# Patient Record
Sex: Female | Born: 1980 | Race: White | Hispanic: No | Marital: Married | Smoking: Never smoker
Health system: Southern US, Community
[De-identification: ages and names within clinical notes are randomized; demographics above are authoritative.]

## PROBLEM LIST (undated history)

## (undated) DIAGNOSIS — D649 Anemia, unspecified: Secondary | ICD-10-CM

## (undated) DIAGNOSIS — D333 Benign neoplasm of cranial nerves: Secondary | ICD-10-CM

## (undated) DIAGNOSIS — N200 Calculus of kidney: Secondary | ICD-10-CM

## (undated) DIAGNOSIS — R87820 Cervical low risk human papillomavirus (HPV) DNA test positive: Secondary | ICD-10-CM

## (undated) DIAGNOSIS — T783XXA Angioneurotic edema, initial encounter: Secondary | ICD-10-CM

## (undated) DIAGNOSIS — L509 Urticaria, unspecified: Secondary | ICD-10-CM

## (undated) HISTORY — PX: ESSURE TUBAL LIGATION: SUR464

## (undated) HISTORY — DX: Benign neoplasm of cranial nerves: D33.3

## (undated) HISTORY — PX: TUBAL LIGATION: SHX77

## (undated) HISTORY — PX: EYE SURGERY: SHX253

## (undated) HISTORY — DX: Anemia, unspecified: D64.9

## (undated) HISTORY — DX: Calculus of kidney: N20.0

## (undated) HISTORY — DX: Cervical low risk human papillomavirus (HPV) DNA test positive: R87.820

## (undated) HISTORY — DX: Urticaria, unspecified: L50.9

## (undated) HISTORY — DX: Angioneurotic edema, initial encounter: T78.3XXA

---

## 2008-06-08 HISTORY — PX: CHOLECYSTECTOMY: SHX55

## 2012-12-14 HISTORY — PX: ACOUSTIC NEUROMA RESECTION: SHX5713

## 2013-07-19 LAB — BASIC METABOLIC PANEL
Creatinine: 0.8 mg/dL (ref 0.5–1.1)
GLUCOSE: 81 mg/dL
Potassium: 4.3 mmol/L (ref 3.4–5.3)

## 2013-07-19 LAB — LIPID PANEL
CHOLESTEROL: 139 mg/dL (ref 0–200)
HDL: 50 mg/dL (ref 35–70)
LDL CALC: 66 mg/dL
Triglycerides: 115 mg/dL (ref 40–160)

## 2013-07-19 LAB — TSH: TSH: 5.1 u[IU]/mL (ref 0.41–5.90)

## 2013-07-19 LAB — HEPATIC FUNCTION PANEL
ALT: 33 U/L (ref 7–35)
AST: 26 U/L (ref 13–35)

## 2013-07-19 LAB — CALCIUM: Calcium: 9 mg/dL

## 2014-03-13 ENCOUNTER — Ambulatory Visit: Payer: Self-pay | Admitting: Family Medicine

## 2014-03-20 ENCOUNTER — Ambulatory Visit (INDEPENDENT_AMBULATORY_CARE_PROVIDER_SITE_OTHER): Payer: 59 | Admitting: Family Medicine

## 2014-03-20 ENCOUNTER — Encounter (INDEPENDENT_AMBULATORY_CARE_PROVIDER_SITE_OTHER): Payer: Self-pay

## 2014-03-20 ENCOUNTER — Encounter: Payer: Self-pay | Admitting: Family Medicine

## 2014-03-20 VITALS — BP 112/75 | HR 87 | Ht 69.0 in | Wt 214.0 lb

## 2014-03-20 DIAGNOSIS — H905 Unspecified sensorineural hearing loss: Secondary | ICD-10-CM

## 2014-03-20 DIAGNOSIS — R208 Other disturbances of skin sensation: Secondary | ICD-10-CM

## 2014-03-20 DIAGNOSIS — R200 Anesthesia of skin: Secondary | ICD-10-CM | POA: Insufficient documentation

## 2014-03-20 DIAGNOSIS — L659 Nonscarring hair loss, unspecified: Secondary | ICD-10-CM

## 2014-03-20 NOTE — Progress Notes (Signed)
CC: Sarah Simmons is a 33 y.o. female is here for Establish Care and losing hair   Subjective: HPI:  Pleasant 33 year old here to establish care nurse with Sarah Simmons cone  Patient complains of right facial numbness has been present for about 2 years now on almost daily basis. 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 Symptoms were present prior to the resection following resection. Symptoms have been more noticeable bothersome over the past one to 2 months. Currently localized to the cheek extending down to the corner of the lip worse after periods of fatigue nothing else seems to make better or worse. She denies any other new motor or sensory disturbances but has been left with 100% hearing loss in the right ear.  She wants to know whether be wise to get a new opinion from a local neurologist.  Complains of hair loss localized to the scalp that has been present for the past 2-4 weeks on a daily basis. It is described as homogenous and occurring nowhere else on the body. Nothing seems to make symptoms better or worse interventions have included avoiding brushing were not necessary. She has a family history of hypothyroidism in her grandmother and she has been told that she had borderline "thyroid problems"in the past. She's also had unintentional 16, weight loss over the summer without any change in diet or physical activity. She denies any psychological or physical trauma/stress over the past 6 months   Review of Systems - General ROS: negative for - chills, fever, night sweats,  weight loss Ophthalmic ROS: negative for - decreased vision Psychological ROS: negative for - anxiety or depression ENT ROS: negative for - hearing change, nasal congestion, tinnitus or allergies Hematological and Lymphatic ROS: negative for - bleeding problems, bruising or swollen  lymph nodes Breast ROS: negative Respiratory ROS: no cough, shortness of breath, or wheezing Cardiovascular ROS: no chest pain or dyspnea on exertion Gastrointestinal ROS: no abdominal pain, change in bowel habits, or black or bloody stools Genito-Urinary ROS: negative for - genital discharge, genital ulcers, incontinence or abnormal bleeding from genitals Musculoskeletal ROS: negative for - joint pain or muscle pain Neurological ROS: negative for - headaches or memory loss Dermatological ROS: negative for lumps, mole changes, rash and skin lesion changes  History reviewed. No pertinent past medical history.  Past Surgical History  Procedure Laterality Date  . 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 Cholecystectomy  2010  . Eye surgery      x3   Family History  Problem Relation Age of Onset  . Uterine cancer Sister   . Liver cancer Father   . Transient ischemic attack      grandparents    History   Social History  . Marital Status: Married    Spouse Name: N/A    Number of Children: N/A  . Years of Education: N/A   Occupational History  . Not on file.   Social History Main Topics  . Smoking status: Never Smoker   . Smokeless tobacco: Not on file  . Alcohol Use: No  . Drug Use: No  . Sexual Activity: Yes    Partners: Male   Other Topics Concern  . Not on file   Social History Narrative  . No narrative on file     Objective: BP 112/75  Pulse 87  Ht 5\' 9"  (1.753 m)  Wt 214 lb (97.07 kg)  BMI 31.59 kg/m2  LMP 02/18/2014  General: Alert and Oriented, No Acute Distress HEENT: Pupils equal, round, reactive to light. Conjunctivae clear.  Moist mucous membranes pharynx unremarkable. Mild seborrheic dermatitis involving the entire scalp but mostly centrally, hair is thin in a homogenous pattern but negative pull test and no discrete areas of alopecia. Eyebrows and eyelashes unremarkable Lungs: Clear comfortable work of breathing Cardiac: Regular  rate and rhythm. Extremities: No peripheral edema.  Strong peripheral pulses.  Mental Status: No depression, anxiety, nor agitation. Skin: Warm and dry.  Assessment & Plan: Sarah Simmons was seen today for establish care and losing hair.  Diagnoses and associated orders for this visit:  Right facial numbness - Ambulatory referral to Neurology - BASIC METABOLIC PANEL WITH GFR  Hair loss - TSH - T4, free - T3, free - BASIC METABOLIC PANEL WITH GFR  Right-sided sensorineural hearing loss    Right facial numbness: Agree with referral to neurology pending metabolic panel above. I like her to have the opportunity for EMG if she is a candidate she does not believe she's never had this done.   Hair loss: Rule out thyroid abnormality and metabolic abnormality above the for further interventions.   Return if symptoms worsen or fail to improve.

## 2014-03-21 ENCOUNTER — Encounter: Payer: Self-pay | Admitting: *Deleted

## 2014-03-21 LAB — BASIC METABOLIC PANEL WITH GFR
BUN: 10 mg/dL (ref 6–23)
CO2: 24 meq/L (ref 19–32)
Calcium: 8.8 mg/dL (ref 8.4–10.5)
Chloride: 104 mEq/L (ref 96–112)
Creat: 0.84 mg/dL (ref 0.50–1.10)
GFR, Est Non African American: >89 mL/min
GLUCOSE: 86 mg/dL (ref 70–99)
POTASSIUM: 4.5 meq/L (ref 3.5–5.3)
Sodium: 138 mEq/L (ref 135–145)

## 2014-03-21 LAB — TSH: TSH: 2.305 u[IU]/mL (ref 0.350–4.500)

## 2014-03-21 LAB — T4, FREE: Free T4: 1.06 ng/dL (ref 0.80–1.80)

## 2014-03-21 LAB — T3, FREE: T3, Free: 3.3 pg/mL (ref 2.3–4.2)

## 2014-03-23 ENCOUNTER — Telehealth: Payer: Self-pay | Admitting: *Deleted

## 2014-03-23 NOTE — Telephone Encounter (Signed)
Pt called wanting to know lab results. Called # she left [phone], and [phone] ( I  couldn't tell what #) and both vm were female voices so I didn't leave a message

## 2014-04-11 ENCOUNTER — Ambulatory Visit (INDEPENDENT_AMBULATORY_CARE_PROVIDER_SITE_OTHER): Payer: 59 | Admitting: Diagnostic Neuroimaging

## 2014-04-11 ENCOUNTER — Encounter: Payer: Self-pay | Admitting: Diagnostic Neuroimaging

## 2014-04-11 ENCOUNTER — Telehealth: Payer: Self-pay | Admitting: *Deleted

## 2014-04-11 VITALS — BP 112/70 | HR 86 | Temp 97.9°F | Ht 69.0 in | Wt 218.4 lb

## 2014-04-11 DIAGNOSIS — H933X1 Disorders of right acoustic nerve: Secondary | ICD-10-CM

## 2014-04-11 DIAGNOSIS — R208 Other disturbances of skin sensation: Secondary | ICD-10-CM

## 2014-04-11 DIAGNOSIS — R200 Anesthesia of skin: Secondary | ICD-10-CM

## 2014-04-11 NOTE — Patient Instructions (Signed)
Monitor symptoms. I will review records and scans when available.

## 2014-04-11 NOTE — Progress Notes (Signed)
[facility]  PATIENT: Sarah Simmons DOB: 08-24-1980  REFERRING CLINICIAN: Hommel HISTORY FROM: patient  REASON FOR VISIT: new consult    HISTORICAL  CHIEF COMPLAINT:  Chief Complaint  Patient presents with  . Numbness    facial, R side    HISTORY OF PRESENT ILLNESS:   33 year old right-handed female here for evaluation of right facial numbness.  April 2014 patient was living in [place] and she noticed a small patch of numbness near her right cheekbone region. Symptoms were intermittent. She also had mild dizziness and balance problems. 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 Following surgery patient developed 100% right-sided hearing loss. She also had worsening balance problems. Right facial numbness resolved until October 2014 when it began to return intermittently.  Patient continues to have tingling, numbness, heaviness, pulling sensation in her right face maxillary region, once per week up to 3 times per day. Episodes can last 30 seconds up to 10 minutes at a time. No specific triggering factors. Patient has had total of 4 MRI scans of the brain and last scan was in February 2015, apparently unremarkable without tumor recurrence.  Patient has seen 2 neurosurgeons and a neurologist regarding ongoing intermittent facial numbness. Both neurosurgeons mention that they could not explain the facial numbness based on her acoustic neuroma or MRI scans. The neurologist stated that it was possible to have numbness explained by her condition.  Patient moved to [facility] in April 2015. Now presenting to establish with neurologist here.   REVIEW OF SYSTEMS: Full 14 system review of systems performed and notable only for skin rash numbness.  ALLERGIES: No Known Allergies  HOME MEDICATIONS: No outpatient prescriptions prior to visit.   No  facility-administered medications prior to visit.    PAST MEDICAL HISTORY: History reviewed. No pertinent past medical history.  PAST SURGICAL HISTORY: Past Surgical History  Procedure Laterality Date  . 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 Cholecystectomy  2010  . Eye surgery      x3    FAMILY HISTORY: Family History  Problem Relation Age of Onset  . Uterine cancer Sister   . Liver cancer Father   . Cancer Father   . Transient ischemic attack      grandparents  . Hypothyroidism Maternal Grandmother   . Transient ischemic attack Maternal Grandfather   . Transient ischemic attack Paternal Grandfather     SOCIAL HISTORY:  History   Social History  . Marital Status: Married    Spouse Name: [name]    Number of Children: 3  . Years of Education: Assoc   Occupational History  . RN [employer]   Social History Main Topics  . Smoking status: Never Smoker   . Smokeless tobacco: Never Used  . Alcohol Use: 0.0 oz/week    0 Not specified per week     Comment: rarely  . Drug Use: No  . Sexual Activity:    Partners: Male   Other Topics Concern  . Not on file   Social History Narrative   Patient lives at home with family.   Caffeine Use: occ.     PHYSICAL EXAM  Filed Vitals:   04/11/14 1117  BP: 112/70  Pulse: 86  Temp: 97.9 F (36.6 C)  TempSrc: Oral  Height: 5\' 9"  (1.753 m)  Weight: 218 lb 6.4 oz (99.066  kg)    Not recorded      Visual Acuity Screening   Right eye Left eye Both eyes  Without correction:     With correction: 20/50 20/50      Body mass index is 32.24 kg/(m^2).  GENERAL EXAM: Patient is in no distress; well developed, nourished and groomed; neck is supple  CARDIOVASCULAR: Regular rate and rhythm, no murmurs, no carotid bruits  NEUROLOGIC: MENTAL STATUS: awake, alert, oriented to person, place and time, recent and remote memory intact, normal attention and concentration, language fluent, comprehension  intact, naming intact, fund of knowledge appropriate CRANIAL NERVE: no papilledema on fundoscopic exam, pupils equal and reactive to light, visual fields full to confrontation, extraocular muscles intact, no nystagmus, SLIGHT DECR LT SENS IN RIGHT MAXILLARY / CHEEK BONE REGION (1 [place]), facial strength symmetric, hearing intact, palate elevates symmetrically, uvula midline, shoulder shrug symmetric, tongue midline. MOTOR: normal bulk and tone, full strength in the BUE, BLE SENSORY: normal and symmetric to light touch, pinprick, temperature, vibration COORDINATION: finger-nose-finger, fine finger movements normal REFLEXES: deep tendon reflexes present and symmetric GAIT/STATION: narrow based gait; able to walk on toes, heels and tandem, romberg is negative    DIAGNOSTIC DATA (LABS, IMAGING, TESTING) - I reviewed patient records, labs, notes, testing and imaging myself where available.  No results found for: WBC, HGB, HCT, MCV, PLT    Component Value Date/Time   NA 138 03/20/2014 1415   K 4.5 03/20/2014 1415   CL 104 03/20/2014 1415   CO2 24 03/20/2014 1415   GLUCOSE 86 03/20/2014 1415   BUN 10 03/20/2014 1415   CREATININE 0.84 03/20/2014 1415   CALCIUM 8.8 03/20/2014 1415   GFRNONAA >89 03/20/2014 1415   GFRAA >89 03/20/2014 1415   No results found for: CHOL, HDL, LDLCALC, LDLDIRECT, TRIG, CHOLHDL No results found for: HGBA1C No results found for: VITAMINB12 Lab Results  Component Value Date   TSH 2.305 03/20/2014       ASSESSMENT AND PLAN  33 y.o. year old female here with history of right acoustic neuroma status post resection July 2014, with ongoing intermittent right facial numbness. I would like to review prior imaging to see if there is any evidence of initial tumor involvement affecting the right trigeminal nerve. Other possibility would include primary right facial nerve dysfunction with abnormal feedback causing sensory disturbance.   PLAN: - monitor  symptoms - records requested (notes and MRIs)  Return in about 3 months (around 07/12/2014).    Penni Bombard, MD 37/0/4888, 91:69 PM Certified in Neurology, Neurophysiology and Neuroimaging  [facility] [address] [place], [place] [address] [phone]

## 2014-04-11 NOTE — Telephone Encounter (Signed)
Requested records from [facility] on patient 04-11-14

## 2014-04-12 ENCOUNTER — Encounter: Payer: Self-pay | Admitting: Family Medicine

## 2014-04-12 NOTE — Telephone Encounter (Signed)
Received records from [facility] on 04-12-14 for Dr Leta Baptist.

## 2014-04-19 NOTE — Telephone Encounter (Signed)
Received a CD  And report from [facility] 04-19-14.

## 2014-06-09 ENCOUNTER — Encounter: Payer: Self-pay | Admitting: Family Medicine

## 2014-06-14 ENCOUNTER — Encounter: Payer: Self-pay | Admitting: Family Medicine

## 2014-07-17 ENCOUNTER — Ambulatory Visit: Payer: Self-pay | Admitting: Diagnostic Neuroimaging

## 2014-07-18 ENCOUNTER — Encounter: Payer: Self-pay | Admitting: Diagnostic Neuroimaging

## 2014-08-29 ENCOUNTER — Emergency Department
Admission: EM | Admit: 2014-08-29 | Discharge: 2014-08-29 | Disposition: A | Discharge status: Home / Self Care | Payer: 59 | Source: Home / Self Care

## 2014-08-29 ENCOUNTER — Encounter: Payer: Self-pay | Admitting: *Deleted

## 2014-08-29 DIAGNOSIS — R1013 Epigastric pain: Secondary | ICD-10-CM | POA: Diagnosis not present

## 2014-08-29 DIAGNOSIS — M949 Disorder of cartilage, unspecified: Secondary | ICD-10-CM

## 2014-08-29 DIAGNOSIS — R0789 Other chest pain: Secondary | ICD-10-CM

## 2014-08-29 MED ORDER — MELOXICAM 15 MG PO TABS: 15.0000 mg | ORAL_TABLET | Freq: Every day | ORAL | Status: DC

## 2014-08-29 NOTE — ED Notes (Signed)
Pt c/o sharp mid epigastric abd pain with nausea and sweating x 1830.

## 2014-08-29 NOTE — ED Provider Notes (Addendum)
CSN: [id number]     Arrival date & time 08/29/14  1948 History   None    Chief Complaint  Patient presents with  . Abdominal Pain      HPI Comments: Patient works third shift as a Marine scientist, and while sleeping one hour ago she was awakened suddenly by a sharp stabbing pain in her epigastric and sub-xiphoid area that radiated to her back.  She had nausea without vomiting.  The pain is mild now but still present.  She states that the pain has occurred about once per week at rest for the past 3 months and usually lasts 2 to 5 minutes before resolving spontaneously.  Today's episode was worse than usual.  The pain is not affected by food intake, and is somewhat worse with movement.  No cough or shortness of breath.  No fevers, chills, and sweats.  No urinary symptoms.  She feels well otherwise.  No history of GERD.  She has a surgical history of cholecystectomy in 2008  Patient is a 34 y.o. female presenting with abdominal pain. The history is provided by the patient.  Abdominal Pain Pain location:  Epigastric Pain quality: sharp, shooting and stabbing   Pain quality: not aching, not bloating and not burning   Pain radiates to:  Back Pain severity:  Severe Onset quality:  Sudden Duration:  1 hour Timing:  Constant Progression:  Partially resolved Chronicity:  Recurrent Context: awakening from sleep   Context: not diet changes, not eating, not recent illness, not suspicious food intake and not trauma   Relieved by:  None tried Worsened by:  Movement Ineffective treatments:  None tried Associated symptoms: nausea   Associated symptoms: no anorexia, no belching, no chest pain, no chills, no constipation, no cough, no diarrhea, no dysuria, no fatigue, no fever, no hematemesis, no hematochezia, no hematuria, no melena, no shortness of breath, no sore throat, no vaginal bleeding, no vaginal discharge and no vomiting   Risk factors: obesity     History reviewed. No pertinent past medical  history. Past Surgical History  Procedure Laterality Date  . 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 Cholecystectomy  2010  . Eye surgery      x3  . Essure tubal ligation     Family History  Problem Relation Age of Onset  . Uterine cancer Sister   . Liver cancer Father   . Cancer Father   . Transient ischemic attack      grandparents  . Hypothyroidism Maternal Grandmother   . Transient ischemic attack Maternal Grandfather   . Transient ischemic attack Paternal Grandfather    History  Substance Use Topics  . Smoking status: Never Smoker   . Smokeless tobacco: Never Used  . Alcohol Use: 0.0 oz/week    0 Standard drinks or equivalent per week     Comment: rarely   OB History    No data available     Review of Systems  Constitutional: Negative for fever, chills, diaphoresis, appetite change and fatigue.  HENT: Negative.  Negative for sore throat.   Eyes: Negative.   Respiratory: Negative for cough and shortness of breath.   Cardiovascular: Negative for chest pain.  Gastrointestinal: Positive for nausea and abdominal pain. Negative for vomiting, diarrhea, constipation, melena, hematochezia, abdominal distention, anorexia and hematemesis.  Genitourinary: Negative for dysuria, urgency, hematuria, flank pain, vaginal bleeding, vaginal discharge and pelvic pain.  Skin: Negative.     Allergies  Review of patient's allergies  indicates no known allergies.  Home Medications   Prior to Admission medications   Medication Sig Start Date End Date Taking? Authorizing Provider  meloxicam (MOBIC) 15 MG tablet Take 1 tablet (15 mg total) by mouth daily. Take with food each morning 08/29/14   Kandra Nicolas, MD   BP 121/87 mmHg  Pulse 76  Temp(Src) 97.9 F (36.6 C) (Oral)  Resp 18  SpO2 100%  LMP 07/26/2014 Physical Exam  Constitutional: She is oriented to person, place, and time. She appears well-developed and well-nourished. No distress.  HENT:  Head:  Normocephalic.  Nose: Nose normal.  Mouth/Throat: Oropharynx is clear and moist.  Eyes: Conjunctivae are normal. Pupils are equal, round, and reactive to light.  Neck: Neck supple.  Cardiovascular: Normal heart sounds.   Pulmonary/Chest: Breath sounds normal. She exhibits no mass and no tenderness.    Patient has marked tenderness to palpation over her xiphoid.  Palpation there recreates her pain.  The tenderness does not extend to costal margin or superior sternum.  Abdominal: Bowel sounds are normal. She exhibits no distension and no mass. There is no hepatosplenomegaly. There is no guarding and no CVA tenderness. No hernia.  Musculoskeletal: She exhibits no edema.  Lymphadenopathy:    She has no cervical adenopathy.  Neurological: She is alert and oriented to person, place, and time.  Skin: Skin is warm and dry. No rash noted. She is not diaphoretic.  Nursing note and vitals reviewed.   ED Course  Procedures none   Labs Reviewed  CBC WITH DIFFERENTIAL/PLATELET  COMPLETE METABOLIC PANEL WITH GFR  AMYLASE  LIPASE   EKG: Rate:  74 BPM PR:  190 msec QT:  384 msec QTcH:  408 msec QRSD:  94 msec QRS axis:  61 degrees Interpretation:  Normal sinus rhythm; within normal limits       MDM   1. Sarah Simmons    Although probably not GI in origin, well check CMP, CBC, amylase, and lipase . Begin Mobic 15mg  daily. Apply ice pack for 20 to 30 minutes, 3 to 4 times daily  Continue until pain decreases.  Followup with Dr. Aundria Mems ([facility])     Kandra Nicolas, MD 08/30/14 [id number]  Kandra Nicolas, MD 08/30/14 [phone]

## 2014-08-29 NOTE — Discharge Instructions (Signed)
Apply ice pack for 20 to 30 minutes, 3 to 4 times daily  Continue until pain decreases.  °

## 2014-08-31 LAB — COMPLETE METABOLIC PANEL WITH GFR
ALBUMIN: 3.8 g/dL (ref 3.5–5.2)
ALK PHOS: 78 U/L (ref 39–117)
ALT: 17 U/L (ref 0–35)
AST: 13 U/L (ref 0–37)
BUN: 16 mg/dL (ref 6–23)
CO2: 23 mEq/L (ref 19–32)
Calcium: 8.6 mg/dL (ref 8.4–10.5)
Chloride: 107 mEq/L (ref 96–112)
Creat: 0.76 mg/dL (ref 0.50–1.10)
GFR, Est African American: >89 mL/min
GFR, Est Non African American: >89 mL/min
Glucose, Bld: 90 mg/dL (ref 70–99)
POTASSIUM: 4.4 meq/L (ref 3.5–5.3)
SODIUM: 139 meq/L (ref 135–145)
TOTAL PROTEIN: 6.5 g/dL (ref 6.0–8.3)
Total Bilirubin: 0.5 mg/dL (ref 0.2–1.2)

## 2014-08-31 LAB — CBC WITH DIFFERENTIAL/PLATELET
BASOS ABS: 0 10*3/uL (ref 0.0–0.1)
Basophils Relative: 0 % (ref 0–1)
EOS PCT: 4 % (ref 0–5)
Eosinophils Absolute: 0.2 10*3/uL (ref 0.0–0.7)
HEMATOCRIT: 42 % (ref 36.0–46.0)
Hemoglobin: 13.5 g/dL (ref 12.0–15.0)
LYMPHS ABS: 1.7 10*3/uL (ref 0.7–4.0)
LYMPHS PCT: 28 % (ref 12–46)
MCH: 26.8 pg (ref 26.0–34.0)
MCHC: 32.1 g/dL (ref 30.0–36.0)
MCV: 83.3 fL (ref 78.0–100.0)
MONO ABS: 0.4 10*3/uL (ref 0.1–1.0)
MONOS PCT: 7 % (ref 3–12)
MPV: 10.4 fL (ref 8.6–12.4)
NEUTROS ABS: 3.8 10*3/uL (ref 1.7–7.7)
Neutrophils Relative %: 61 % (ref 43–77)
Platelets: 354 10*3/uL (ref 150–400)
RBC: 5.04 MIL/uL (ref 3.87–5.11)
RDW: 13.9 % (ref 11.5–15.5)
WBC: 6.2 10*3/uL (ref 4.0–10.5)

## 2014-08-31 LAB — AMYLASE: Amylase: 60 U/L (ref 0–105)

## 2014-08-31 LAB — LIPASE: Lipase: 31 U/L (ref 0–75)

## 2014-09-05 ENCOUNTER — Telehealth: Payer: Self-pay | Admitting: *Deleted

## 2014-10-17 ENCOUNTER — Ambulatory Visit (INDEPENDENT_AMBULATORY_CARE_PROVIDER_SITE_OTHER): Payer: 59 | Admitting: Family Medicine

## 2014-10-17 ENCOUNTER — Ambulatory Visit (INDEPENDENT_AMBULATORY_CARE_PROVIDER_SITE_OTHER): Payer: 59

## 2014-10-17 ENCOUNTER — Encounter: Payer: Self-pay | Admitting: Family Medicine

## 2014-10-17 VITALS — BP 118/82 | HR 77 | Wt 219.0 lb

## 2014-10-17 DIAGNOSIS — M79671 Pain in right foot: Secondary | ICD-10-CM

## 2014-10-17 NOTE — Progress Notes (Signed)
CC: Sarah Simmons is a 34 y.o. female is here for rt foot swelling   Subjective: HPI:  Right foot pain and swelling localized to the dorsal surface of the foot. It's been persistent since it came on abruptly a week and a half ago. It's painful with pressing on the site of swelling but otherwise not painful. She denies any redness warmth or overlying skin changes at the site of discomfort. She's never had this before. She denies any overexertion or recent trauma. She tells me this site has never been painful in the past. Denies edema, or any motor or sensory disturbances   Review Of Systems Outlined In HPI  No past medical history on file.  Past Surgical History  Procedure Laterality Date  . 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 Cholecystectomy  2010  . Eye surgery      x3  . Essure tubal ligation     Family History  Problem Relation Age of Onset  . Uterine cancer Sister   . Liver cancer Father   . Cancer Father   . Transient ischemic attack      grandparents  . Hypothyroidism Maternal Grandmother   . Transient ischemic attack Maternal Grandfather   . Transient ischemic attack Paternal Grandfather     History   Social History  . Marital Status: Married    Spouse Name: Sarah Simmons  . Number of Children: 3  . Years of Education: Assoc   Occupational History  . RN [employer]   Social History Main Topics  . Smoking status: Never Smoker   . Smokeless tobacco: Never Used  . Alcohol Use: 0.0 oz/week    0 Standard drinks or equivalent per week     Comment: rarely  . Drug Use: No  . Sexual Activity:    Partners: Male   Other Topics Concern  . Not on file   Social History Narrative   Patient lives at home with family.   Caffeine Use: occ.     Objective: BP 118/82 mmHg  Pulse 77  Wt 219 lb (99.338 kg)  Vital signs reviewed. General: Alert and Oriented, No Acute Distress HEENT: Pupils equal, round, reactive to light. Conjunctivae clear.   External ears unremarkable.  Moist mucous membranes. Lungs: Clear and comfortable work of breathing, speaking in full sentences without accessory muscle use. Cardiac: Regular rate and rhythm.  Neuro: CN II-XII grossly intact, gait normal. Extremities: No peripheral edema.  Strong peripheral pulses. On the right foot at the dorsal surface of the navicular cuneiform articulation there is a slightly tender spongy nodule approximately 1&1/2 cm in diameterwithout any overlying skin changes Mental Status: No depression, anxiety, nor agitation. Logical though process. Skin: Warm and dry.  Assessment & Plan: Julee was seen today for rt foot swelling.  Diagnoses and all orders for this visit:  Right foot pain Orders: -     DG Foot Complete Right; Future   high suspicion for ganglion cyst. Obtain x-ray to rule out more serious pathology and to get a better idea of the degree of arthritis that could have produced this. The plan will be based on above results  Return if symptoms worsen or fail to improve.

## 2014-10-18 ENCOUNTER — Telehealth: Payer: Self-pay | Admitting: Family Medicine

## 2014-10-18 MED ORDER — DICLOFENAC SODIUM 50 MG PO TBEC: DELAYED_RELEASE_TABLET | ORAL | Status: DC

## 2014-10-18 NOTE — Telephone Encounter (Signed)
Andre,a Will you please let patient know that her xray did not show any bone abnormality at the site of her swelling and I'm still suspicious this is due to a ganglion cyst.  I've sent a Rx of diclofenac to the [facility] outpatient [facility] and if no better after four-five days schedule an appt with Dr. Darene Lamer in our sports medicine clinic for possible drainage.

## 2014-10-18 NOTE — Telephone Encounter (Signed)
Patient advised of results and recommendations.  

## 2014-11-13 ENCOUNTER — Encounter: Payer: Self-pay | Admitting: Sports Medicine

## 2014-11-13 ENCOUNTER — Ambulatory Visit (INDEPENDENT_AMBULATORY_CARE_PROVIDER_SITE_OTHER): Payer: 59 | Admitting: Sports Medicine

## 2014-11-13 VITALS — BP 121/86 | HR 71 | Ht 69.0 in | Wt 221.0 lb

## 2014-11-13 DIAGNOSIS — M779 Enthesopathy, unspecified: Principal | ICD-10-CM

## 2014-11-13 DIAGNOSIS — M778 Other enthesopathies, not elsewhere classified: Secondary | ICD-10-CM | POA: Insufficient documentation

## 2014-11-13 DIAGNOSIS — M6588 Other synovitis and tenosynovitis, other site: Secondary | ICD-10-CM

## 2014-11-13 NOTE — Progress Notes (Signed)
   Subjective:    I'm seeing this patient as a consultation for:  Dr. Ileene Rubens  CC: right foot swelling  HPI: This is a pleasant 34 year old female, for the past several weeks she's had increasing swelling over the dorsum of the right foot, minimally tender, she fails conservative measures including NSAIDs, icing and compression. Pain is mild, persistent. No radiation.she denies any constitutional symptoms, no trauma, and no prior episodes of joint swelling.  Past medical history, Surgical history, Family history not pertinant except as noted below, Social history, Allergies, and medications have been entered into the medical record, reviewed, and no changes needed.   Review of Systems: No headache, visual changes, nausea, vomiting, diarrhea, constipation, dizziness, abdominal pain, skin rash, fevers, chills, night sweats, weight loss, swollen lymph nodes, body aches, joint swelling, muscle aches, chest pain, shortness of breath, mood changes, visual or auditory hallucinations.   Objective:   General: Well Developed, well nourished, and in no acute distress.  Neuro/Psych: Alert and oriented x3, extra-ocular muscles intact, able to move all 4 extremities, sensation grossly intact. Skin: Warm and dry, no rashes noted.  Respiratory: Not using accessory muscles, speaking in full sentences, trachea midline.  Cardiovascular: Pulses palpable, no extremity edema. Abdomen: Does not appear distended. Right Foot: Visibly fall over the medial midfoot Range of motion is full in all directions. Strength is 5/5 in all directions. No hallux valgus. No pes cavus or pes planus. No abnormal callus noted. No pain over the navicular prominence, or base of fifth metatarsal. No tenderness to palpation of the calcaneal insertion of plantar fascia. No pain at the Achilles insertion. No pain over the calcaneal bursa. No pain of the retrocalcaneal bursa. No tenderness to palpation over the tarsals, metatarsals,  or phalanges. No hallux rigidus or limitus. No tenderness palpation over interphalangeal joints. No pain with compression of the metatarsal heads. Neurovascularly intact distally. Palpable tenderness and fullness over the extensor hallucis longus tendon at the level of the tarsometatarsal joint  Procedure: Real-time Ultrasound Guided Injection of right extensor hallucis longus tendon sheath Device: GE Logiq E  Verbal informed consent obtained.  Time-out conducted.  Noted no overlying erythema, induration, or other signs of local infection.  Skin prepped in a sterile fashion.  Local anesthesia: Topical Ethyl chloride.  With sterile technique and under real time ultrasound guidance: noted extensive fluid in the EHL tendon sheath at the level of the tarsometatarsal joint, 25-gauge needle advanced into the sheath, and 1 mL kenalog 40, 4 mL lidocaine injected easily.  Completed without difficulty  Pain immediately resolved suggesting accurate placement of the medication.  Advised to call if fevers/chills, erythema, induration, drainage, or persistent bleeding.  Images permanently stored and available for review in the ultrasound unit.  Impression: Technically successful ultrasound guided injection.  Impression and Recommendations:   This case required medical decision making of moderate complexity.

## 2014-11-13 NOTE — Assessment & Plan Note (Signed)
Has failed conservative measures. She has a highly copious extensor hallucis longus tendon sheath fluid collection. Guided injection as above. Strap with compressive dressing. Return in one month.

## 2014-12-04 ENCOUNTER — Ambulatory Visit (INDEPENDENT_AMBULATORY_CARE_PROVIDER_SITE_OTHER): Payer: 59 | Admitting: Sports Medicine

## 2014-12-04 ENCOUNTER — Encounter: Payer: Self-pay | Admitting: Sports Medicine

## 2014-12-04 VITALS — BP 121/81 | HR 83 | Wt 216.0 lb

## 2014-12-04 DIAGNOSIS — M6588 Other synovitis and tenosynovitis, other site: Secondary | ICD-10-CM | POA: Diagnosis not present

## 2014-12-04 DIAGNOSIS — R0789 Other chest pain: Secondary | ICD-10-CM | POA: Insufficient documentation

## 2014-12-04 DIAGNOSIS — M778 Other enthesopathies, not elsewhere classified: Secondary | ICD-10-CM

## 2014-12-04 DIAGNOSIS — M779 Enthesopathy, unspecified: Secondary | ICD-10-CM

## 2014-12-04 NOTE — Assessment & Plan Note (Signed)
Referable to the substernal space/xiphoid process. I'm unable to localize any pain to the epigastrium in the abdominal cavity just under the xiphoid. Next step would be an MRI of the anterior chest wall, we will wait until we get blood work back for further diagnostics.

## 2014-12-04 NOTE — Progress Notes (Signed)

## 2014-12-04 NOTE — Assessment & Plan Note (Signed)
Complete resolution of the copious synovitis as well as pain associated with the extensor hallucis longus. This is post injection. There still is mild pain referable to the underlying medial cuneiform/first metatarsal joint. Custom orthotics as above, we will do this before considering any further interventional treatment. Considering fairly often migratory symptoms we are going to do a rheumatoid workup as well.

## 2014-12-05 LAB — CBC WITH DIFFERENTIAL/PLATELET
Basophils Absolute: 0 K/uL (ref 0.0–0.1)
Basophils Relative: 0 % (ref 0–1)
Eosinophils Absolute: 0.1 K/uL (ref 0.0–0.7)
Eosinophils Relative: 2 % (ref 0–5)
HCT: 44 % (ref 36.0–46.0)
Hemoglobin: 14.1 g/dL (ref 12.0–15.0)
Lymphocytes Relative: 31 % (ref 12–46)
Lymphs Abs: 2.2 K/uL (ref 0.7–4.0)
MCH: 26.4 pg (ref 26.0–34.0)
MCHC: 32 g/dL (ref 30.0–36.0)
MCV: 82.2 fL (ref 78.0–100.0)
MPV: 10.2 fL (ref 8.6–12.4)
Monocytes Absolute: 0.4 K/uL (ref 0.1–1.0)
Monocytes Relative: 6 % (ref 3–12)
Neutro Abs: 4.3 10*3/uL (ref 1.7–7.7)
Neutrophils Relative %: 61 % (ref 43–77)
Platelets: 374 K/uL (ref 150–400)
RBC: 5.35 MIL/uL — ABNORMAL HIGH (ref 3.87–5.11)
RDW: 14 % (ref 11.5–15.5)
WBC: 7.1 10*3/uL (ref 4.0–10.5)

## 2014-12-05 LAB — COMPREHENSIVE METABOLIC PANEL
AST: 14 U/L (ref 0–37)
Albumin: 3.6 g/dL (ref 3.5–5.2)
Alkaline Phosphatase: 86 U/L (ref 39–117)
BUN: 12 mg/dL (ref 6–23)
CO2: 27 mEq/L (ref 19–32)
Calcium: 8.9 mg/dL (ref 8.4–10.5)
Chloride: 104 mEq/L (ref 96–112)
Creat: 0.75 mg/dL (ref 0.50–1.10)
Glucose, Bld: 81 mg/dL (ref 70–99)
Total Bilirubin: 0.8 mg/dL (ref 0.2–1.2)
Total Protein: 6.5 g/dL (ref 6.0–8.3)

## 2014-12-05 LAB — SEDIMENTATION RATE: Sed Rate: 6 mm/h (ref 0–20)

## 2014-12-05 LAB — COMPREHENSIVE METABOLIC PANEL WITH GFR
ALT: 20 U/L (ref 0–35)
Potassium: 4.1 meq/L (ref 3.5–5.3)
Sodium: 141 meq/L (ref 135–145)

## 2014-12-05 LAB — CK: Total CK: 37 U/L (ref 7–177)

## 2014-12-05 LAB — CYCLIC CITRUL PEPTIDE ANTIBODY, IGG: Cyclic Citrullin Peptide Ab: <2 U/mL (ref 0.0–5.0)

## 2014-12-05 LAB — RHEUMATOID FACTOR: Rheumatoid fact SerPl-aCnc: <10 [IU]/mL (ref <=14)

## 2014-12-05 LAB — ANA: Anti Nuclear Antibody(ANA): NEGATIVE

## 2014-12-28 ENCOUNTER — Ambulatory Visit: Payer: 59 | Admitting: Sports Medicine

## 2015-02-05 ENCOUNTER — Encounter: Payer: Self-pay | Admitting: Family Medicine

## 2015-02-05 ENCOUNTER — Ambulatory Visit (INDEPENDENT_AMBULATORY_CARE_PROVIDER_SITE_OTHER): Payer: 59 | Admitting: Family Medicine

## 2015-02-05 VITALS — BP 115/81 | HR 78 | Wt 222.0 lb

## 2015-02-05 DIAGNOSIS — H918X2 Other specified hearing loss, left ear: Secondary | ICD-10-CM | POA: Diagnosis not present

## 2015-02-05 DIAGNOSIS — H6122 Impacted cerumen, left ear: Secondary | ICD-10-CM

## 2015-02-05 NOTE — Progress Notes (Signed)
CC: Sarah Simmons is a 34 y.o. female is here for check ears   Subjective: HPI:  Left-sided hearing loss moderate severity present for the last week on a daily basis. Present all hours of the day. Nothing particularly makes it better or worse. She's had this in the past and required removal of cerumen impaction. She denies any pain or discharge. No other motor or sensory disturbances. She's had hearing loss in the right ear ever since a surgery back in 2014. She denies headaches, nasal congestion, fevers, chills nor sore throat. No dizziness or mental disturbance no interventions as of yet   Review Of Systems Outlined In HPI  No past medical history on file.  Past Surgical History  Procedure Laterality Date  . 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 Cholecystectomy  2010  . Eye surgery      x3  . Essure tubal ligation     Family History  Problem Relation Age of Onset  . Uterine cancer Sister   . Liver cancer Father   . Cancer Father   . Transient ischemic attack      grandparents  . Hypothyroidism Maternal Grandmother   . Transient ischemic attack Maternal Grandfather   . Transient ischemic attack Paternal Grandfather     Social History   Social History  . Marital Status: Married    Spouse Name: [name]  . Number of Children: 3  . Years of Education: Assoc   Occupational History  . RN [employer]   Social History Main Topics  . Smoking status: Never Smoker   . Smokeless tobacco: Never Used  . Alcohol Use: 0.0 oz/week    0 Standard drinks or equivalent per week     Comment: rarely  . Drug Use: No  . Sexual Activity:    Partners: Male   Other Topics Concern  . Not on file   Social History Narrative   Patient lives at home with family.   Caffeine Use: occ.     Objective: BP 115/81 mmHg  Pulse 78  Wt 222 lb (100.699 kg)  General: Alert and Oriented, No Acute Distress HEENT: Pupils equal, round, reactive to light. Conjunctivae  clear.  On initial exam the left ear has a cerumen impaction. After removal canal is clear with normal tympanic membranes. Moist mucous membranes, pharynx without inflammation nor lesions.  Neck supple without palpable lymphadenopathy nor abnormal masses. Lungs: clearing comfortable work of breathing Cardiac: Regular rate and rhythm.  Extremities: No peripheral edema.  Strong peripheral pulses.  Mental Status: No depression, anxiety, nor agitation. Skin: Warm and dry.  Assessment & Plan: Sarah Simmons was seen today for check ears.  Diagnoses and all orders for this visit:  Hearing loss of left ear due to cerumen impaction   Hearing was grossly restored per the patient after successful removal of the ear wax.  Indication: Cerumen impaction of the left ear Medical necessity statement: On physical examination, cerumen impairs clinically significant portions of the external auditory canal, and tympanic membrane. Noted obstructive, copious cerumen that cannot be removed without magnification and instrumentations requiring physician skills Consent: Discussed benefits and risks of procedure and verbal consent obtained Procedure: Patient was prepped for the procedure. Utilized an otoscope to assess and take note of the ear canal, the tympanic membrane, and the presence, amount, and placement of the cerumen. Gentle water irrigation and soft plastic curette was utilized to remove cerumen.  Post procedure examination: shows cerumen was completely removed.  Patient tolerated procedure well. The patient is made aware that they may experience temporary vertigo, temporary hearing loss, and temporary discomfort. If these symptom last for more than 24 hours to call the clinic or proceed to the ED.    Return if symptoms worsen or fail to improve.

## 2015-02-05 NOTE — Progress Notes (Deleted)
CC: Sarah Simmons is a 34 y.o. female is here for check ears   Subjective: HPI:     Review Of Systems Outlined In HPI  No past medical history on file.  Past Surgical History  Procedure Laterality Date  . 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 Cholecystectomy  2010  . Eye surgery      x3  . Essure tubal ligation     Family History  Problem Relation Age of Onset  . Uterine cancer Sister   . Liver cancer Father   . Cancer Father   . Transient ischemic attack      grandparents  . Hypothyroidism Maternal Grandmother   . Transient ischemic attack Maternal Grandfather   . Transient ischemic attack Paternal Grandfather     Social History   Social History  . Marital Status: Married    Spouse Name: Sarah Simmons  . Number of Children: 3  . Years of Education: Assoc   Occupational History  . RN [employer]   Social History Main Topics  . Smoking status: Never Smoker   . Smokeless tobacco: Never Used  . Alcohol Use: 0.0 oz/week    0 Standard drinks or equivalent per week     Comment: rarely  . Drug Use: No  . Sexual Activity:    Partners: Male   Other Topics Concern  . Not on file   Social History Narrative   Patient lives at home with family.   Caffeine Use: occ.     Objective: BP 115/81 mmHg  Pulse 78  Wt 222 lb (100.699 kg)  General: Alert and Oriented, No Acute Distress HEENT: Pupils equal, round, reactive to light. Conjunctivae clear.  External ears unremarkable, canals clear with intact TMs with appropriate landmarks.  Middle ear appears open without effusion. Pink inferior turbinates.  Moist mucous membranes, pharynx without inflammation nor lesions.  Neck supple without palpable lymphadenopathy nor abnormal masses. Lungs: Clear to auscultation bilaterally, no wheezing/ronchi/rales.  Comfortable work of breathing. Good air movement. Cardiac: Regular rate and rhythm. Normal S1/S2.  No murmurs, rubs, nor gallops.   Abdomen: Normal  bowel sounds, soft and non tender without palpable masses. Extremities: No peripheral edema.  Strong peripheral pulses.  Mental Status: No depression, anxiety, nor agitation. Skin: Warm and dry.  Assessment & Plan: Sarah Simmons was seen today for check ears.  Diagnoses and all orders for this visit:  Hearing loss of left ear due to cerumen impaction     No Follow-up on file.

## 2015-04-01 ENCOUNTER — Telehealth: Payer: Self-pay | Admitting: Diagnostic Neuroimaging

## 2015-04-01 NOTE — Telephone Encounter (Signed)
Spoke with patient and informed her that she last saw Dr Leta Baptist Nov 2015. Informed her that she was not seen for her scheduled FU in March . Advised Dr Leta Baptist will need to see her for FU and can discuss MRI with her at that time. She agreed to schedule FU for this purpose; scheduled apt next week.

## 2015-04-01 NOTE — Telephone Encounter (Signed)
Patient is calling to discuss scheduling an MRI because of a brain tumor she had in 2014. Please call and discuss. Thank you.

## 2015-04-01 NOTE — Telephone Encounter (Signed)
Agree with follow up appt. -VRP

## 2015-04-09 ENCOUNTER — Ambulatory Visit (INDEPENDENT_AMBULATORY_CARE_PROVIDER_SITE_OTHER): Payer: 59 | Admitting: Diagnostic Neuroimaging

## 2015-04-09 ENCOUNTER — Encounter: Payer: Self-pay | Admitting: Diagnostic Neuroimaging

## 2015-04-09 VITALS — BP 118/87 | HR 88 | Ht 69.0 in | Wt 226.6 lb

## 2015-04-09 DIAGNOSIS — R208 Other disturbances of skin sensation: Secondary | ICD-10-CM

## 2015-04-09 DIAGNOSIS — H933X1 Disorders of right acoustic nerve: Secondary | ICD-10-CM

## 2015-04-09 DIAGNOSIS — D333 Benign neoplasm of cranial nerves: Secondary | ICD-10-CM | POA: Insufficient documentation

## 2015-04-09 DIAGNOSIS — R200 Anesthesia of skin: Secondary | ICD-10-CM

## 2015-04-09 NOTE — Progress Notes (Signed)
[facility]  PATIENT: Sarah Simmons DOB: [birth year]  REFERRING CLINICIAN: Hommel HISTORY FROM: patient  REASON FOR VISIT: follow up   HISTORICAL  CHIEF COMPLAINT:  Chief Complaint  Patient presents with  . Acoustic neuroma    rm 6  . Follow-up    1 year    HISTORY OF PRESENT ILLNESS:   UPDATE 04/09/15: Since last visit symptoms stable. No new issues. Balance still slight off, esp if tired.  PRIOR HPI (04/12/14): 34 year old right-handed female here for evaluation of right facial numbness. April 2014 patient was living in [place] and she noticed a small patch of numbness near her right cheekbone region. Symptoms were intermittent. She also had mild dizziness and balance problems. 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 Following surgery patient developed 100% right-sided hearing loss. She also had worsening balance problems. Right facial numbness resolved until October 2014 when it began to return intermittently. Patient continues to have tingling, numbness, heaviness, pulling sensation in her right face maxillary region, once per week up to 3 times per day. Episodes can last 30 seconds up to 10 minutes at a time. No specific triggering factors. Patient has had total of 4 MRI scans of the brain and last scan was in February 2015, apparently unremarkable without tumor recurrence. Patient has seen 2 neurosurgeons and a neurologist regarding ongoing intermittent facial numbness. Both neurosurgeons mention that they could not explain the facial numbness based on her acoustic neuroma or MRI scans. The neurologist stated that it was possible to have numbness explained by her condition. Patient moved to [facility] in April 2015. Now presenting to establish with neurologist here.   REVIEW OF SYSTEMS: Full 14 system review of systems performed and notable only  for dizziness.  ALLERGIES: No Known Allergies  HOME MEDICATIONS: No outpatient prescriptions prior to visit.   No facility-administered medications prior to visit.    PAST MEDICAL HISTORY: History reviewed. No pertinent past medical history.  PAST SURGICAL HISTORY: Past Surgical History  Procedure Laterality Date  . 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 Cholecystectomy  2010  . Eye surgery      x3  . Essure tubal ligation      FAMILY HISTORY: Family History  Problem Relation Age of Onset  . Uterine cancer Sister   . Liver cancer Father   . Cancer Father   . Transient ischemic attack      grandparents  . Hypothyroidism Maternal Grandmother   . Transient ischemic attack Maternal Grandfather   . Transient ischemic attack Paternal Grandfather     SOCIAL HISTORY:  Social History   Social History  . Marital Status: Married    Spouse Name: [name]  . Number of Children: 3  . 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 Smoking status: Never Smoker   . Smokeless tobacco: Never Used  . Alcohol Use: 0.0 oz/week    0 Standard drinks or equivalent per week     Comment: rarely  . Drug Use: No  . Sexual Activity:    Partners: Male   Other Topics Concern  . Not on file   Social History Narrative   Patient lives at home with family.   Caffeine Use: occ.     PHYSICAL EXAM  Filed  Vitals:   04/09/15 1441  BP: 118/87  Pulse: 88  Height: 5\' 9"  (1.753 m)  Weight: 226 lb 9.6 oz (102.785 kg)    Not recorded     No exam data present   Body mass index is 33.45 kg/(m^2).  GENERAL EXAM: Patient is in no distress; well developed, nourished and groomed; neck is supple  CARDIOVASCULAR: Regular rate and rhythm, no murmurs, no carotid bruits  NEUROLOGIC: MENTAL STATUS: awake, alert, oriented to person, place and time, recent and remote memory intact, normal attention and  concentration, language fluent, comprehension intact, naming intact, fund of knowledge appropriate CRANIAL NERVE: no papilledema on fundoscopic exam, pupils equal and reactive to light, visual fields full to confrontation, extraocular muscles intact, no nystagmus, SLIGHT DECR SENS IN RIGHT MAXILLARY / CHEEK BONE REGION (1 [place]), facial strength symmetric, hearing intact, palate elevates symmetrically, uvula midline, shoulder shrug symmetric, tongue midline. MOTOR: normal bulk and tone, full strength in the BUE, BLE SENSORY: normal and symmetric to light touch, pinprick, temperature, vibration COORDINATION: finger-nose-finger, fine finger movements normal REFLEXES: deep tendon reflexes present and symmetric GAIT/STATION: narrow based gait; able to walk tandem, romberg is negative    DIAGNOSTIC DATA (LABS, IMAGING, TESTING) - I reviewed patient records, labs, notes, testing and imaging myself where available.  Lab Results  Component Value Date   WBC 7.1 12/04/2014   HGB 14.1 12/04/2014   HCT 44.0 12/04/2014   MCV 82.2 12/04/2014   PLT 374 12/04/2014      Component Value Date/Time   NA 141 12/04/2014 1554   K 4.1 12/04/2014 1554   CL 104 12/04/2014 1554   CO2 27 12/04/2014 1554   GLUCOSE 81 12/04/2014 1554   BUN 12 12/04/2014 1554   CREATININE 0.75 12/04/2014 1554   CREATININE 0.8 07/19/2013   CALCIUM 8.9 12/04/2014 1554   CALCIUM 9.0 07/19/2013   PROT 6.5 12/04/2014 1554   ALBUMIN 3.6 12/04/2014 1554   AST 14 12/04/2014 1554   ALT 20 12/04/2014 1554   ALKPHOS 86 12/04/2014 1554   BILITOT 0.8 12/04/2014 1554   GFRNONAA >89 08/29/2014 2019   GFRAA >89 08/29/2014 2019   Lab Results  Component Value Date   CHOL 139 07/19/2013   HDL 50 07/19/2013   LDLCALC 66 07/19/2013   TRIG 115 07/19/2013   No results found for: HGBA1C No results found for: VITAMINB12 Lab Results  Component Value Date   TSH 2.305 03/20/2014       ASSESSMENT AND PLAN  34 y.o.  year old female here with history of right acoustic neuroma status post resection July 2014, with ongoing intermittent right facial numbness.Possibilities would include initial tumor involvement affecting the right trigeminal nerve vs primary right facial nerve dysfunction with abnormal feedback causing sensory disturbance. Will check another MRI scan as last scan was in Feb 2015.    PLAN: - I will check MRI brain - stay active and move as much as possible; try yoga, dance or other exercise class  Orders Placed This Encounter  Procedures  . MR Brain/IAC Wo/W Cm   Return in about 1 year (around 04/08/2016).    Penni Bombard, MD 76/10/4648, 3:54 PM Certified in Neurology, Neurophysiology and Neuroimaging  [facility] [address] [place], [place] [address] [phone]

## 2015-04-09 NOTE — Patient Instructions (Addendum)
Thank you for coming to see [place] at [facility]. I hope we have been able to provide you high quality care today.  You may receive a patient satisfaction survey over the next few weeks. We would appreciate your feedback and comments so that we may continue to improve ourselves and the health of our patients.  - I will check MRI brain - stay active and move as much as possible; try yoga, dance or other exercise class   ~~~~~~~~~~~~~~~~~~~~~~~~~~~~~~~~~~~~~~~~~~~~~~~~~~~~~~~~~~~~~~~~~  DR. PENUMALLI'S GUIDE TO HAPPY AND HEALTHY LIVING These are some of my general health and wellness recommendations. Some of them may apply to you better than others. Please use common sense as you try these suggestions and feel free to ask me any questions.   ACTIVITY/FITNESS Mental, social, emotional and physical stimulation are very important for brain and body health. Try learning a new activity (arts, music, language, sports, games).  Keep moving your body to the best of your abilities. You can do this at home, inside or outside, the park, community center, gym or anywhere you like. Consider a physical therapist or personal trainer to get started. Consider the app Sworkit. Fitness trackers such as smart-watches, smart-phones or Fitbits can help as well.   NUTRITION Eat more plants: colorful vegetables, nuts, seeds and berries.  Eat less sugar, salt, preservatives and processed foods.  Avoid toxins such as cigarettes and alcohol.  Drink water when you are thirsty. Warm water with a slice of lemon is an excellent morning drink to start the day.  Consider these websites for more information The Nutrition Source (https://www.henry-hernandez.biz/) Precision Nutrition (WindowBlog.ch)   RELAXATION Consider practicing mindfulness meditation or other relaxation techniques such as deep breathing, prayer, yoga, tai chi, massage. See website  mindful.org or the apps Headspace or Calm to help get started.   SLEEP Try to get at least 7-8+ hours sleep per day. Regular exercise and reduced caffeine will help you sleep better. Practice good sleep hygeine techniques. See website sleep.org for more information.   PLANNING Prepare estate planning, living will, healthcare POA documents. Sometimes this is best planned with the help of an attorney. Theconversationproject.org and agingwithdignity.org are excellent resources.

## 2015-04-19 ENCOUNTER — Ambulatory Visit (HOSPITAL_COMMUNITY)
Admission: RE | Admit: 2015-04-19 | Discharge: 2015-04-19 | Disposition: A | Discharge status: Home / Self Care | Payer: 59 | Source: Ambulatory Visit | Attending: Diagnostic Neuroimaging | Admitting: Diagnostic Neuroimaging

## 2015-04-19 DIAGNOSIS — Z86018 Personal history of other benign neoplasm: Secondary | ICD-10-CM | POA: Insufficient documentation

## 2015-04-19 DIAGNOSIS — H933X1 Disorders of right acoustic nerve: Secondary | ICD-10-CM | POA: Insufficient documentation

## 2015-04-19 DIAGNOSIS — R200 Anesthesia of skin: Secondary | ICD-10-CM | POA: Diagnosis not present

## 2015-04-19 DIAGNOSIS — R208 Other disturbances of skin sensation: Secondary | ICD-10-CM | POA: Diagnosis not present

## 2015-04-19 DIAGNOSIS — H9311 Tinnitus, right ear: Secondary | ICD-10-CM | POA: Insufficient documentation

## 2015-04-19 MED ORDER — GADOBENATE DIMEGLUMINE 529 MG/ML IV SOLN
20.0000 mL | Freq: Once | INTRAVENOUS | Status: AC | PRN
  Administered 2015-04-19: 20 mL via INTRAVENOUS

## 2015-04-25 ENCOUNTER — Telehealth: Payer: Self-pay | Admitting: Diagnostic Neuroimaging

## 2015-04-25 NOTE — Telephone Encounter (Signed)
Pt called sts looked at MRI on mychart and would like the results explained to her

## 2015-04-25 NOTE — Telephone Encounter (Signed)
I called patient reviewed MRI scan results. MRI indicates 2 small foci of enhancing tissue near the right internal auditory canal. Could represent residual tumor versus scar tissue. We'll need to compare with prior MRI scans from [place] to see if this represents residual scar tissue versus growing residual tumor. Patient's symptoms are stable. I do not think she needs surgical evaluation at this time. Patient will try to obtain prior MRI scan discs and reports and provide him to [place] for comparison.  Sarah Bombard, MD [id number], [id number] PM Certified in Neurology, Neurophysiology and Neuroimaging  [facility] [address] [place], [place] [address] [phone]

## 2015-05-15 ENCOUNTER — Encounter: Payer: Self-pay | Admitting: Diagnostic Neuroimaging

## 2015-05-16 ENCOUNTER — Telehealth: Payer: Self-pay | Admitting: *Deleted

## 2015-05-16 NOTE — Telephone Encounter (Signed)
Scans reviewed. Message sent via mychart. Also tried via phone but could not reach patient.

## 2015-05-16 NOTE — Telephone Encounter (Addendum)
Called home phone and got immediate busy tone x 2 attempts. Called mobile number, got [employer] recording stating call cannot be completed as this phone has restrictions. Attempting to reach patient to reply to her My Chart message. Sent e mail reply to patient's My Chart e mail informing her that Dr Leta Baptist has received her prior scans and is reviewing them today. Informed her she will receive a call when he has completed his review.

## 2015-12-09 ENCOUNTER — Ambulatory Visit (INDEPENDENT_AMBULATORY_CARE_PROVIDER_SITE_OTHER): Payer: 59 | Admitting: Family Medicine

## 2015-12-09 ENCOUNTER — Encounter: Payer: Self-pay | Admitting: Family Medicine

## 2015-12-09 VITALS — BP 118/82 | HR 77 | Temp 98.6°F | Wt 227.0 lb

## 2015-12-09 DIAGNOSIS — A499 Bacterial infection, unspecified: Secondary | ICD-10-CM | POA: Diagnosis not present

## 2015-12-09 DIAGNOSIS — J329 Chronic sinusitis, unspecified: Secondary | ICD-10-CM | POA: Diagnosis not present

## 2015-12-09 DIAGNOSIS — B9689 Other specified bacterial agents as the cause of diseases classified elsewhere: Secondary | ICD-10-CM

## 2015-12-09 MED ORDER — AMOXICILLIN-POT CLAVULANATE 500-125 MG PO TABS: ORAL_TABLET | ORAL | Status: AC

## 2015-12-09 NOTE — Progress Notes (Signed)
CC: Sarah Simmons is a 35 y.o. female is here for Cough; Ear Fullness; and Sore Throat   Subjective: HPI:  For little over a week now she's been suffering from facial pressure, nasal congestion, postnasal drip and nonproductive cough with some pressure in her ears. Symptoms are worse first thing in the morning. They have not responded to rest or Zyrtec. No other interventions as of yet. She denies any chest discomfort or shortness of breath or wheezing. She denies any photophobia or ocular complaints. Symptoms are moderate in severity and not getting better or worse since abrupt onset week ago    Review Of Systems Outlined In HPI  No past medical history on file.  Past Surgical History  Procedure Laterality Date  . 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 Cholecystectomy  2010  . Eye surgery      x3  . Essure tubal ligation     Family History  Problem Relation Age of Onset  . Uterine cancer Sister   . Liver cancer Father   . Cancer Father   . Transient ischemic attack      grandparents  . Hypothyroidism Maternal Grandmother   . Transient ischemic attack Maternal Grandfather   . Transient ischemic attack Paternal Grandfather     Social History   Social History  . Marital Status: Married    Spouse Name: [name]  . Number of Children: 3  . 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 Smoking status: Never Smoker   . Smokeless tobacco: Never Used  . Alcohol Use: 0.0 oz/week    0 Standard drinks or equivalent per week     Comment: rarely  . Drug Use: No  . Sexual Activity:    Partners: Male   Other Topics Concern  . Not on file   Social History Narrative   Patient lives at home with family.   Caffeine Use: occ.     Objective: BP 118/82 mmHg  Pulse 77  Temp(Src) 98.6 F (37 C) (Oral)  Wt 227 lb (102.967 kg)  General: Alert and Oriented, No Acute Distress HEENT: Pupils equal,  round, reactive to light. Conjunctivae clear.  External ears unremarkable, canals clear with intact TMs with appropriate landmarks.  Middle ear appears open without effusion. Pink inferior turbinates.  Moist mucous membranes, pharynx without inflammation nor lesionsOther than postnasal drip.  Neck supple without palpable lymphadenopathy nor abnormal masses. Lungs: Clear to auscultation bilaterally, no wheezing/ronchi/rales.  Comfortable work of breathing. Good air movement. Cardiac: Regular rate and rhythm. Normal S1/S2.  No murmurs, rubs, nor gallops.   Extremities: No peripheral edema.  Strong peripheral pulses.  Mental Status: No depression, anxiety, nor agitation. Skin: Warm and dry.  Assessment & Plan: Clemma was seen today for cough, ear fullness and sore throat.  Diagnoses and all orders for this visit:  Bacterial sinusitis -     amoxicillin-clavulanate (AUGMENTIN) 500-125 MG tablet; Take one by mouth every 8 hours for ten total days.   Bacterial sinusitis: Start Augmentin consider nasal saline washes and call if no better by Friday of this week.  Return if symptoms worsen or fail to improve.

## 2016-01-14 DIAGNOSIS — H818X1 Other disorders of vestibular function, right ear: Secondary | ICD-10-CM | POA: Insufficient documentation

## 2016-01-14 DIAGNOSIS — H9191 Unspecified hearing loss, right ear: Secondary | ICD-10-CM | POA: Insufficient documentation

## 2016-04-08 ENCOUNTER — Ambulatory Visit: Payer: 59 | Admitting: Diagnostic Neuroimaging

## 2016-04-09 ENCOUNTER — Encounter: Payer: Self-pay | Admitting: Diagnostic Neuroimaging

## 2016-11-20 ENCOUNTER — Ambulatory Visit (INDEPENDENT_AMBULATORY_CARE_PROVIDER_SITE_OTHER): Payer: Managed Care, Other (non HMO) | Admitting: Physician Assistant

## 2016-11-20 ENCOUNTER — Encounter: Payer: Self-pay | Admitting: Physician Assistant

## 2016-11-20 ENCOUNTER — Other Ambulatory Visit: Payer: Self-pay | Admitting: Physician Assistant

## 2016-11-20 VITALS — BP 102/73 | HR 76 | Ht 69.0 in | Wt 215.0 lb

## 2016-11-20 DIAGNOSIS — N644 Mastodynia: Secondary | ICD-10-CM

## 2016-11-20 DIAGNOSIS — E559 Vitamin D deficiency, unspecified: Secondary | ICD-10-CM

## 2016-11-20 DIAGNOSIS — M79621 Pain in right upper arm: Secondary | ICD-10-CM | POA: Diagnosis not present

## 2016-11-20 DIAGNOSIS — M79622 Pain in left upper arm: Secondary | ICD-10-CM | POA: Diagnosis not present

## 2016-11-20 DIAGNOSIS — R634 Abnormal weight loss: Secondary | ICD-10-CM | POA: Diagnosis not present

## 2016-11-20 DIAGNOSIS — R5383 Other fatigue: Secondary | ICD-10-CM | POA: Diagnosis not present

## 2016-11-20 LAB — TSH: TSH: 2.02 m[IU]/L

## 2016-11-20 LAB — COMPREHENSIVE METABOLIC PANEL
ALK PHOS: 89 U/L (ref 33–115)
ALT: 18 U/L (ref 6–29)
AST: 13 U/L (ref 10–30)
Albumin: 3.6 g/dL (ref 3.6–5.1)
BILIRUBIN TOTAL: 0.6 mg/dL (ref 0.2–1.2)
BUN: 13 mg/dL (ref 7–25)
CO2: 21 mmol/L (ref 20–31)
Calcium: 8.7 mg/dL (ref 8.6–10.2)
Chloride: 108 mmol/L (ref 98–110)
Creat: 0.84 mg/dL (ref 0.50–1.10)
Glucose, Bld: 95 mg/dL (ref 65–99)
Potassium: 4 mmol/L (ref 3.5–5.3)
Sodium: 141 mmol/L (ref 135–146)
Total Protein: 6.3 g/dL (ref 6.1–8.1)

## 2016-11-20 LAB — CBC
HEMATOCRIT: 44 % (ref 35.0–45.0)
Hemoglobin: 14 g/dL (ref 11.7–15.5)
MCH: 26.3 pg — ABNORMAL LOW (ref 27.0–33.0)
MCHC: 31.8 g/dL — ABNORMAL LOW (ref 32.0–36.0)
MCV: 82.6 fL (ref 80.0–100.0)
MPV: 10.7 fL (ref 7.5–12.5)
Platelets: 328 10*3/uL (ref 140–400)
RBC: 5.33 MIL/uL — ABNORMAL HIGH (ref 3.80–5.10)
RDW: 14.2 % (ref 11.0–15.0)
WBC: 5.5 10*3/uL (ref 3.8–10.8)

## 2016-11-20 NOTE — Progress Notes (Signed)
HPI:                                                                Sarah Simmons is a 36 y.o. female who presents to [facility] today to establish care  Current Concerns include:  Patient is mainly concerned today about "swelling" and tenderness in her right axilla beginning 2-3 weeks ago. She denies any warmth or erythema. She also reports shooting right nipple pain on 2 occurrences.  She denies any changes in her breasts, including palpable lump, nipple discharge or skin change. Patient reports she has a strong family history of cancer, including uterine cancer in her sister, and so she is very concerned and worried about these symptoms.  Fatigue worsening for the last month. She also reports unintended weight loss, approximately 20 pounds over 6 months. She denies fever, chills, nightsweats, cough, abdominal pain, change in bowel habits, hematochezia, or melena. History significant for anemia.   Health Maintenance Health Maintenance  Topic Date Due  . HIV Screening  08/19/1995  . PAP SMEAR  07/18/2016  . INFLUENZA VACCINE  01/06/2017  . TETANUS/TDAP  11/13/2018    GYN/Sexual Health  Menstrual status: having periods  LMP: 11/15/16  Menses: regular  Last pap smear: 07/18/2013, normal per patient  History of abnormal pap smears: no  Sexually active: yes  Current contraception: tubal  Past Medical History:  Diagnosis Date  . Acoustic neuroma ([place])   . Anemia   . Nephrolithiasis    Past Surgical History:  Procedure Laterality Date  . ACOUSTIC NEUROMA RESECTION  12/14/2012   neuroma removal  . CHOLECYSTECTOMY  2010  . ESSURE TUBAL LIGATION    . EYE SURGERY     x3   Social History  Substance Use Topics  . Smoking status: Never Smoker  . Smokeless tobacco: Never Used  . Alcohol use 0.0 oz/week     Comment: rarely   family history includes Cancer in her father; Hypothyroidism in her maternal grandmother; Liver  cancer in her father; Transient ischemic attack in her maternal grandfather and paternal grandfather; Uterine cancer in her sister.  ROS: Review of Systems  Constitutional: Positive for malaise/fatigue and weight loss (20 pounds in 6 months).  HENT: Positive for hearing loss (right-sided hx of acoustic neuroma).   Respiratory: Negative.   Cardiovascular: Negative.   Gastrointestinal: Negative.   Genitourinary: Negative.   Musculoskeletal: Positive for myalgias (right axillary pain).  Neurological: Negative.   Psychiatric/Behavioral: The patient is nervous/anxious.      Medications: No current outpatient prescriptions on file.   No current facility-administered medications for this visit.    No Known Allergies   Objective:  BP 102/73   Pulse 76   Ht 5\' 9"  (1.753 m)   Wt 215 lb (97.5 kg)   LMP 11/13/2016 (Approximate)   BMI 31.75 kg/m  Gen: well-groomed, cooperative, not ill-appearing, no distress HEENT: normal conjunctiva, wearing glasses  Pulm: Normal work of breathing, normal phonation Neuro: alert and oriented x 3, EOM's intact, normal tone, no tremor MSK: moving all extremities, normal gait and station, no peripheral edema Skin: warm and dry, no rashes or lesions on exposed skin Lymph: no axillary adenopathy, tenderness in the inferior axillary fossa proximal to the  breasts bilaterally Psych: normal affect, euthymic mood, normal speech and thought content   No results found for this or any previous visit (from the past 72 hour(s)). No results found.  Depression screen PHQ 2/9 11/20/2016  Decreased Interest 0  Down, Depressed, Hopeless 0  PHQ - 2 Score 0     Assessment and Plan: 36 y.o. female with  1. Tenderness of right axilla - no visible or palpable abnormality on exam - plan to ultrasound right [facility] and [facility] - [facility] BREAST LTD UNI RIGHT INC AXILLA; Future  2. Axillary tenderness, left - [place] AXILLA LEFT; Future  3.  Fatigue, unspecified type - CBC - Comprehensive metabolic panel - C-reactive protein - Ferritin - Sedimentation rate - TSH - Vitamin B12 - Vit D  25 hydroxy (rtn osteoporosis monitoring)  4. Unintentional weight loss - CBC - Comprehensive metabolic panel - C-reactive protein - Ferritin - Sedimentation rate - TSH - Vitamin B12 - Vit D  25 hydroxy (rtn osteoporosis monitoring)   Patient education and anticipatory guidance given Patient agrees with treatment plan Follow-up in 1 week or sooner as needed  Darlyne Russian PA-C

## 2016-11-21 LAB — VITAMIN B12: Vitamin B-12: 448 pg/mL (ref 200–1100)

## 2016-11-21 LAB — C-REACTIVE PROTEIN: CRP: 2.4 mg/L (ref <8.0)

## 2016-11-21 LAB — VITAMIN D 25 HYDROXY (VIT D DEFICIENCY, FRACTURES): Vit D, 25-Hydroxy: 17 ng/mL — ABNORMAL LOW (ref 30–100)

## 2016-11-21 LAB — SEDIMENTATION RATE: Sed Rate: 1 mm/hr (ref 0–20)

## 2016-11-21 LAB — FERRITIN: Ferritin: 65 ng/mL (ref 10–154)

## 2016-11-23 MED ORDER — VITAMIN D (ERGOCALCIFEROL) 1.25 MG (50000 UNIT) PO CAPS: 50000.0000 [IU] | ORAL_CAPSULE | ORAL | 0 refills | Status: DC

## 2016-11-23 NOTE — Progress Notes (Signed)
Hi Taneia,  Your labs look great. You have vitamin D deficiency. I am sending a prescription for vitamin D supplement. You take 1 pill weekly for 8 weeks, then we will re-check your level. You will want to continue a daily over-the-counter supplement after.  Your labs look good - normal electrolytes and kidney function - normal blood counts, no evidence of anemia - no evidence of thyroid disease - no evidence of diabetes  Best, Evlyn Clines

## 2016-11-23 NOTE — Addendum Note (Signed)
Addended by: Nelson Chimes E on: 11/23/2016 11:11 AM   Modules accepted: Orders

## 2016-11-24 ENCOUNTER — Emergency Department (HOSPITAL_COMMUNITY)
Admission: EM | Admit: 2016-11-24 | Discharge: 2016-11-24 | Disposition: A | Discharge status: Home / Self Care | Payer: Managed Care, Other (non HMO) | Attending: Emergency Medicine | Admitting: Emergency Medicine

## 2016-11-24 ENCOUNTER — Emergency Department (HOSPITAL_COMMUNITY): Payer: Managed Care, Other (non HMO)

## 2016-11-24 ENCOUNTER — Encounter (HOSPITAL_COMMUNITY): Payer: Self-pay | Admitting: Emergency Medicine

## 2016-11-24 DIAGNOSIS — Y92410 Unspecified street and highway as the place of occurrence of the external cause: Secondary | ICD-10-CM | POA: Insufficient documentation

## 2016-11-24 DIAGNOSIS — Y9389 Activity, other specified: Secondary | ICD-10-CM | POA: Diagnosis not present

## 2016-11-24 DIAGNOSIS — Y999 Unspecified external cause status: Secondary | ICD-10-CM | POA: Diagnosis not present

## 2016-11-24 DIAGNOSIS — S299XXA Unspecified injury of thorax, initial encounter: Secondary | ICD-10-CM | POA: Diagnosis present

## 2016-11-24 DIAGNOSIS — R079 Chest pain, unspecified: Secondary | ICD-10-CM

## 2016-11-24 MED ORDER — IBUPROFEN 400 MG PO TABS
600.0000 mg | ORAL_TABLET | Freq: Once | ORAL | Status: AC
  Administered 2016-11-24: 600 mg via ORAL
  Filled 2016-11-24: qty 1
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
## 2016-11-24 NOTE — ED Notes (Signed)
Patient transported to X-ray 
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
## 2016-11-25 ENCOUNTER — Ambulatory Visit
Admission: RE | Admit: 2016-11-25 | Discharge: 2016-11-25 | Disposition: A | Discharge status: Home / Self Care | Payer: Managed Care, Other (non HMO) | Source: Ambulatory Visit | Attending: Physician Assistant | Admitting: Physician Assistant

## 2016-11-25 ENCOUNTER — Other Ambulatory Visit: Payer: Self-pay | Admitting: Physician Assistant

## 2016-11-25 DIAGNOSIS — M79621 Pain in right upper arm: Secondary | ICD-10-CM

## 2016-11-25 DIAGNOSIS — M79622 Pain in left upper arm: Secondary | ICD-10-CM

## 2016-11-25 DIAGNOSIS — N644 Mastodynia: Secondary | ICD-10-CM

## 2016-12-21 ENCOUNTER — Other Ambulatory Visit: Payer: Self-pay

## 2016-12-21 DIAGNOSIS — E559 Vitamin D deficiency, unspecified: Secondary | ICD-10-CM

## 2016-12-21 MED ORDER — VITAMIN D (ERGOCALCIFEROL) 1.25 MG (50000 UNIT) PO CAPS: 50000.0000 [IU] | ORAL_CAPSULE | ORAL | 0 refills | Status: DC

## 2017-09-08 ENCOUNTER — Emergency Department (INDEPENDENT_AMBULATORY_CARE_PROVIDER_SITE_OTHER)
Admission: EM | Admit: 2017-09-08 | Discharge: 2017-09-08 | Disposition: A | Discharge status: Home / Self Care | Payer: PRIVATE HEALTH INSURANCE | Source: Home / Self Care

## 2017-09-08 ENCOUNTER — Other Ambulatory Visit: Payer: Self-pay

## 2017-09-08 DIAGNOSIS — M79632 Pain in left forearm: Secondary | ICD-10-CM

## 2017-09-08 MED ORDER — DICLOFENAC SODIUM 75 MG PO TBEC: 75.0000 mg | DELAYED_RELEASE_TABLET | Freq: Two times a day (BID) | ORAL | 0 refills | Status: DC

## 2017-09-08 NOTE — Discharge Instructions (Signed)
Return if any problems.

## 2017-09-08 NOTE — ED Triage Notes (Signed)
Pt c/o left arm pain. Started after lifting weights a week ago. Very limited ROM and describes as "burning/ripping" sensation

## 2017-09-09 NOTE — ED Provider Notes (Signed)
[facility]    CSN: [id number] Arrival date & time: 09/08/17  1554     History   Chief Complaint Chief Complaint  Patient presents with  . Arm Injury    Left    HPI Sarah Simmons is a 37 y.o. female.   The history is provided by the patient. No language interpreter was used.  Arm Injury  Location:  Elbow Elbow location:  L elbow Injury: no   Pain details:    Quality:  Aching   Radiates to:  Does not radiate   Severity:  Moderate   Onset quality:  Gradual   Timing:  Constant   Progression:  Worsening Dislocation: no   Relieved by:  Nothing Worsened by:  Nothing Ineffective treatments:  None tried Pt complains of pain in her left elbow since she began lifting weights.    Past Medical History:  Diagnosis Date  . Acoustic neuroma ([place])   . Anemia   . Nephrolithiasis     Patient Active Problem List   Diagnosis Date Noted  . Tenderness of right axilla 11/20/2016  . Axillary tenderness, left 11/20/2016  . Fatigue 11/20/2016  . Unintentional weight loss 11/20/2016  . Deaf, right 01/14/2016  . Right-sided vestibular weakness 01/14/2016  . 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 Chest wall pain 12/04/2014  . 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 Hair loss 03/20/2014  . Right facial numbness 03/20/2014  . Right-sided sensorineural hearing loss 03/20/2014    Past Surgical History:  Procedure Laterality Date  . ACOUSTIC NEUROMA RESECTION  12/14/2012   neuroma removal  . CHOLECYSTECTOMY  2010  . ESSURE TUBAL LIGATION    . EYE SURGERY     x3  . TUBAL LIGATION      OB History   None      Home Medications    Prior to Admission medications   Medication Sig Start Date End Date Taking? Authorizing Provider  diclofenac (VOLTAREN) 75 MG EC tablet Take 1 tablet (75 mg total) by mouth 2 (two) times daily. 09/08/17   Fransico Meadow, PA-C  Vitamin D, Ergocalciferol, (DRISDOL) 50000 units CAPS capsule Take 1 capsule  (50,000 Units total) by mouth every 7 (seven) days. Take for 8 total doses(weeks) 12/21/16   Trixie Dredge, PA-C    Family History Family History  Problem Relation Age of Onset  . Uterine cancer Sister   . Liver cancer Father   . Cancer Father   . Transient ischemic attack Unknown        grandparents  . Hypothyroidism Maternal Grandmother   . Transient ischemic attack Maternal Grandfather   . Transient ischemic attack Paternal Grandfather     Social History Social History   Tobacco Use  . Smoking status: Never Smoker  . Smokeless tobacco: Never Used  Substance Use Topics  . Alcohol use: Yes    Alcohol/week: 0.0 oz    Comment: rarely  . Drug use: No     Allergies   Patient has no known allergies.   Review of Systems Review of Systems  All other systems reviewed and are negative.    Physical Exam Triage Vital Signs ED Triage Vitals  Enc Vitals Group     BP 09/08/17 1616 132/86     Pulse Rate 09/08/17 1616 92     Resp 09/08/17 1616 18     Temp 09/08/17 1616 98.3 F (36.8 C)     Temp Source 09/08/17 1616 Oral  SpO2 09/08/17 1616 98 %     Weight 09/08/17 1617 221 lb (100.2 kg)     Height 09/08/17 1617 5\' 9"  (1.753 m)     Head Circumference --      Peak Flow --      Pain Score 09/08/17 1616 8     Pain Loc --      Pain Edu? --      Excl. in [place]? --    No data found.  Updated Vital Signs BP 132/86 (BP Location: Right Arm)   Pulse 92   Temp 98.3 F (36.8 C) (Oral)   Resp 18   Ht 5\' 9"  (1.753 m)   Wt 221 lb (100.2 kg)   LMP 08/18/2017 (Exact Date)   SpO2 98%   BMI 32.64 kg/m   Visual Acuity Right Eye Distance: 20/30 Left Eye Distance: 20/50 Bilateral Distance: 20/40  Right Eye Near:   Left Eye Near:    Bilateral Near:     Physical Exam  Constitutional: She appears well-developed and well-nourished.  HENT:  Head: Normocephalic.  Eyes: Pupils are equal, round, and reactive to light.  Musculoskeletal: Normal range of motion.    Tender elbow,  Pain with movement.  Tender elbow to forearm.  Neurological: She is alert.  Skin: Skin is warm.  Psychiatric: She has a normal mood and affect.  Nursing note and vitals reviewed.    UC Treatments / Results  Labs (all labs ordered are listed, but only abnormal results are displayed) Labs Reviewed - No data to display  EKG None Radiology No results found.  Procedures Procedures (including critical care time)  Medications Ordered in UC Medications - No data to display   Initial Impression / Assessment and Plan / UC Course  I have reviewed the triage vital signs and the nursing notes.  Pertinent labs & imaging results that were available during my care of the patient were reviewed by me and considered in my medical decision making (see chart for details).     MDM  I suspect tendonitis second to weight lifting.  Pt placed in a sling for 3 days, Pt given rx for diflucan Pt advised to follow up with Sports Medicine next door for evalaution next week.  Final Clinical Impressions(s) / UC Diagnoses   Final diagnoses:  Pain of left forearm    ED Discharge Orders        Ordered    diclofenac (VOLTAREN) 75 MG EC tablet  2 times daily,   Status:  Discontinued     09/08/17 1639    diclofenac (VOLTAREN) 75 MG EC tablet  2 times daily     09/08/17 1640      An After Visit Summary was printed and given to the patient. Controlled Substance Prescriptions [place] Controlled Substance Registry consulted? Not Applicable   Fransico Meadow, [place] 09/09/17 1224

## 2017-09-13 ENCOUNTER — Encounter: Payer: 59 | Admitting: Family Medicine

## 2017-09-14 ENCOUNTER — Encounter: Payer: Self-pay | Admitting: Family Medicine

## 2017-09-14 ENCOUNTER — Ambulatory Visit (INDEPENDENT_AMBULATORY_CARE_PROVIDER_SITE_OTHER): Payer: 59 | Admitting: Family Medicine

## 2017-09-14 VITALS — BP 121/80 | HR 82 | Ht 69.0 in | Wt 225.0 lb

## 2017-09-14 DIAGNOSIS — G5612 Other lesions of median nerve, left upper limb: Secondary | ICD-10-CM

## 2017-09-14 DIAGNOSIS — M7522 Bicipital tendinitis, left shoulder: Secondary | ICD-10-CM

## 2017-09-14 MED ORDER — DICLOFENAC SODIUM 1 % TD GEL: 2.0000 g | Freq: Four times a day (QID) | TRANSDERMAL | 11 refills | Status: DC

## 2017-09-14 NOTE — Progress Notes (Signed)
Sarah Simmons is a 37 y.o. female who presents to [facility] today for left arm pain.   The patient reports that she recently began lifting weights a week and a half ago. She states during her workout she felt something being off, but it was not until the next day that she felt a sharp "burning" pain in her left forearm.  The pain is primarily located in the anterior elbow extending occasionally to the medial elbow.  She notes some burning sensation into her palmar forearm.  Pain seems to be worse with pronation as well as elbow extension and resisted elbow flexion.  She was seen in urgent care on April 4 she was thought to have distal biceps tendinitis.  Prescription oral diclofenac which is helped only a little.  Past Medical History:  Diagnosis Date  . Acoustic neuroma ([place])   . Anemia   . Nephrolithiasis    Past Surgical History:  Procedure Laterality Date  . ACOUSTIC NEUROMA RESECTION  12/14/2012   neuroma removal  . CHOLECYSTECTOMY  2010  . ESSURE TUBAL LIGATION    . EYE SURGERY     x3  . TUBAL LIGATION     Social History   Tobacco Use  . Smoking status: Never Smoker  . Smokeless tobacco: Never Used  Substance Use Topics  . Alcohol use: Yes    Alcohol/week: 0.0 oz    Comment: rarely   family history includes Cancer in her father; Hypothyroidism in her maternal grandmother; Liver cancer in her father; Transient ischemic attack in her maternal grandfather, paternal grandfather, and unknown relative; Uterine cancer in her sister.  ROS as above:  Medications: Current Outpatient Medications  Medication Sig Dispense Refill  . diclofenac (VOLTAREN) 75 MG EC tablet Take 1 tablet (75 mg total) by mouth 2 (two) times daily. 20 tablet 0  . Vitamin D, Ergocalciferol, (DRISDOL) 50000 units CAPS capsule Take 1 capsule (50,000 Units total) by mouth every 7  (seven) days. Take for 8 total doses(weeks) 8 capsule 0  . diclofenac sodium (VOLTAREN) 1 % GEL Apply 2 g topically 4 (four) times daily. To affected joint. 100 g 11   No current facility-administered medications for this visit.    No Known Allergies  Health Maintenance Health Maintenance  Topic Date Due  . HIV Screening  08/19/1995  . PAP SMEAR  07/18/2016  . INFLUENZA VACCINE  01/06/2018  . TETANUS/TDAP  11/13/2018     Exam:  BP 121/80   Pulse 82   Ht 5\' 9"  (1.753 m)   Wt 225 lb (102.1 kg)   LMP 08/18/2017 (Exact Date)   BMI 33.23 kg/m  Gen: Well NAD HEENT: EOMI,  MMM Lungs: Normal work of breathing. CTABL Heart: RRR no MRG Abd: NABS, Soft. Nondistended, Nontender Exts: Brisk capillary refill, warm and well perfused.   MSK:  Left arm:  No erythema, deformities, or obvious effusions.  Range of motion: Flexion extension to about 10 degrees supination lacks 5 deg pronaiton full. Tenderness to palpation at medial elbow and medial forearm.  Pain with pronation of forearm. Pain with resisted flexion of the elbow.  Distal biceps tendon is intact with a normal hook test. Patient has intact strength to resisted flexion and resisted supination.  Wrist pulses capillary refill sensation and grip strength is intact.    Assessment and Plan: 37 y.o. female with arm pain.   The patient's arm pain is likely a strain  of the pronator muscles of the left forearm and biceps tendonitis. At this time, we do not need to pursue further imaging or invasive treatments. Patient should perform hand therapy exercises at home as well as apply diclofenac cream to the painful areas. She should contact me if she does not improve and I will refer her to formal physical therapy. If the pain worsens, she should make an appointment to be seen again.     No orders of the defined types were placed in this encounter.  Meds ordered this encounter  Medications  . diclofenac sodium (VOLTAREN) 1 % GEL      Sig: Apply 2 g topically 4 (four) times daily. To affected joint.    Dispense:  100 g    Refill:  11     Discussed warning signs or symptoms. Please see discharge instructions. Patient expresses understanding.

## 2017-09-14 NOTE — Patient Instructions (Addendum)
Thank you for coming in today. Work in the home exercises.  Apply diclofenac gel for pain up to 4x daily as needed.  If not getting better let me know and I will order hand therapy.  Check back in 4-6 weeks if not better.      Pronator Syndrome The median nerve is a nerve in your forearm that provides feeling to certain parts of your hand. Pronator syndrome is a condition that happens when the median nerve is squeezed (compressed) by a muscle or other structure. The condition can cause weakness or tingling in your thumb, index, middle, and ring fingers, or it can cause a dull ache or pain in your forearm. What are the causes? This condition may be caused by:  Overuse or repetitive motions that increase the size of a muscle in your forearm (pronator teres).  Trauma or a hard, direct hit (blow) to the forearm, resulting in swelling (hematoma).  A birth defect.  What increases the risk? This condition is more likely to develop in:  People who play sports such as baseball, tennis, or golf.  People who have a job that requires grasping objects and twisting the forearm, such as carpentry.  Adults who are 69-3 years old.  Females.  What are the signs or symptoms? Symptoms of this condition include:  An ache or pain in the underside of your forearm close to the elbow.  A tingling or prickling feeling (sensation) in your index, middle, and ring finger as well as your thumb.  Weakness in your hand, particularly the pinch grip between your index finger and thumb.  Symptoms get worse with repetitive movement or rotation of the forearm. How is this diagnosed? This condition can be diagnosed based on your symptoms, your medical history, and a physical exam. During your exam, you may be asked to move your hand, fingers, wrist, and arm in certain ways. Doing this will help your health care provider find the source of your pain. You may also have tests, such as:  An electromyogram. This  test can show how well the median nerve is working and show if there is too much pressure on it or a nearby nerve.  A nerve conduction study. This test measures how well electrical signals pass through your nerves.  An MRI. This test can show nerve problems.  An X-ray. This test may be done to check for an underlying problem, such as a break or crack in a bone.  An ultrasound. This may be done to check for an injury, such as a tear to a ligament or tendon.  How is this treated? This condition may be treated with:  Rest. You will need to limit activities that cause your symptoms to get worse or flare up.  Steroid or anti-inflammatory medicine. These medicines may be prescribed to help with pain and other symptoms.  A splint or brace. You may need to wear a splint or brace for support until your symptoms improve.  Physical therapy. This involves doing hand and arm exercises.  Surgery. This is usually done only if other treatments fail and symptoms continue for more than 6 months.  Follow these instructions at home: If you have a splint or brace:  Wear it as told by your health care provider. Remove it only as told by your health care provider.  Loosen the splint or brace if your fingers tingle, become numb, or turn cold and blue.  If your splint or brace is not waterproof: ? Do  not let it get wet. ? Cover it with a watertight covering when you take a bath or a shower.  Keep the splint or brace clean. Activity  Return to your normal activities as told by your health care provider. Ask your health care provider what activities are safe for you.  Do exercises as told by your health care provider. General instructions  Do not use any tobacco products, such as cigarettes, chewing tobacco, or e-cigarettes. Tobacco can delay healing. If you need help quitting, ask your health care provider.  Take over-the-counter and prescription medicines only as told by your health care  provider.  Keep all follow-up visits as told by your health care provider. This is important. How is this prevented?  Warm up and stretch before being active.  Cool down and stretch after being active.  Give your body time to rest between periods of activity.  Make sure to use equipment that fits you.  Be safe and responsible while being active to avoid falls.  Do at least 150 minutes of moderate-intensity exercise each week, such as brisk walking or water aerobics.  Maintain physical fitness, including: ? Strength. ? Flexibility. ? Cardiovascular fitness. ? Endurance. Contact a health care provider if:  Your symptoms do not improve in 4-6 weeks.  Your symptoms get worse. Get help right away if:  Your pain is severe.  You cannot move part of your hand or arm. This information is not intended to replace advice given to you by your health care provider. Make sure you discuss any questions you have with your health care provider. Document Released: 05/25/2005 Document Revised: 01/28/2016 Document Reviewed: 02/03/2015 Elsevier Interactive Patient Education  2018 [place] your health care provider which exercises are safe for you. Do exercises exactly as told by your health care provider and adjust them as directed. It is normal to feel mild stretching, pulling, tightness, or discomfort as you do these exercises, but you should stop right away if you feel sudden pain or your pain gets worse.Do not begin these exercises until told by your health care provider. Stretching and range of motion exercises These exercises warm up your muscles and joints and improve the movement and flexibility of your forearm. These exercises also help to relieve pain, numbness, and tingling. Exercise A: Wrist flexion and extension, active 1. Bend your left / right elbow to an "L" shape (about 90 degrees). 2. Bend your wrist so your fingers point downward. 3. Gently  bring your wrist up toward the ceiling. 4. Hold this position for __________ seconds. 5. Slowly return to the starting position. Repeat __________ times. Complete this exercise __________ times a day. Exercise B: Forearm rotation, active 1. Stand with your left / right elbow at your side. 2. Bend your left / right elbow to an "L" shape (about 90 degrees). 3. Gently rotate your left / right forearm so your palm faces the floor. 4. Next, gently rotate your forearm so your palm faces the ceiling. 5. Go back and forth between the two rotation motions for __________ seconds. Repeat __________ times. Complete this exercise __________ times a day. Exercise C: Elbow flexion and extension, active 1. Stand with your left / right arm straight at your side. 2. Turn your left / right palm so it faces behind you. 3. Gently bend your left / right elbow so your palm faces the floor. 4. Hold this position for __________ seconds. 5. Slowly return to the starting position. Repeat  __________ times. Complete this exercise __________ times a day. Exercise D: Biceps stretch 1. Stand with your back to a sturdy chair. 2. Rest the back of your left / right hand on the back of the chair. Your elbow should be straight, and your palm should face the ceiling. 3. Slowly take 1-2 steps forward, stopping when you feel a gentle stretch in the top of your forearm or in your biceps. 4. Hold this position for __________ seconds. 5. Slowly return to the starting position. Repeat __________ times. Complete this exercise __________ times a day. Exercise E: Median nerve mobilization for pronator syndrome 1. Stand with your left / right elbow bent to an "L" shape (90 degrees) and your palm facing down. 2. Use your other hand to gently bend your wrist backwards. 3. Gently tilt your head so your left / right ear goes toward your left / right shoulder. As you tilt your head, gently straighten your elbow and allow your wrist to bend  forward. 4. Hold this position for __________ seconds. 5. Slowly return to the starting position. Repeat __________ times. Complete this exercise __________ times a day. This information is not intended to replace advice given to you by your health care provider. Make sure you discuss any questions you have with your health care provider. Document Released: 05/25/2005 Document Revised: 01/28/2016 Document Reviewed: 02/03/2015 Elsevier Interactive Patient Education  2018 [place].    Biceps Tendon Tendinitis (Distal) Rehab Ask your health care provider which exercises are safe for you. Do exercises exactly as told by your health care provider and adjust them as directed. It is normal to feel mild stretching, pulling, tightness, or discomfort as you do these exercises, but you should stop right away if you feel sudden pain or your pain gets worse.Do not begin these exercises until told by your health care provider. Stretching and range of motion exercises These exercises warm up your muscles and joints and improve the movement and flexibility of your arm. These exercises can also help to relieve pain and stiffness. Exercise A: Supination, active-assisted 1. Stand or sit with your left / right elbow bent to an "L" shape (90 degrees). 2. Rotate your palm up until you cannot rotate it anymore. Then, use your other hand to help rotate your left / right forearm more. 3. Hold this position for __________ seconds. 4. Slowly return to the starting position. Repeat __________ times. Complete this exercise __________ times a day. Exercise B: Pronation, active-assisted  1. Stand or sit with your left / right elbow bent to an "L" shape (90 degrees). 2. Rotate your left / right palm down until you cannot rotate it anymore. Then, use your other hand to help rotate your left / right forearm more. 3. Hold this position for __________ seconds. 4. Slowly return to the starting position. Repeat __________  times. Complete this exercise __________ times a day. Strengthening exercises These exercises build strength and endurance in your arm and shoulder. Endurance is the ability to use your muscles for a long time, even after your muscles get tired. Exercise C: Supination  1. Sit with your left / right forearm supported on a table. Your elbow should be at waist height. 2. Rest your hand over the edge of the table, palm-down. 3. Gently grasp a lightweight hammer near the head. As this exercise gets easier for you, try holding the hammer farther down the handle. 4. Without moving your elbow, slowly rotate your palm up so it faces the ceiling.  5. Hold this position for__________ seconds. 6. Slowly return to the starting position. Repeat __________ times. Complete this exercise __________ times a day. Exercise D: Pronation  1. Sit with your left / right forearm supported on a table. Your elbow should be at waist height. 2. Rest your hand over the edge of the table, palm-up. 3. Gently grasp a lightweight hammer near the head. As this exercise gets easier for you, try holding the hammer farther down the handle. 4. Without moving your elbow, slowly rotate your palm down. 5. Hold this position for __________ seconds. 6. Slowly return to the starting position. Repeat __________ times. Complete this exercise __________ times a day. Exercise E: Elbow flexion, supinated  1. Sit on a stable chair without armrests, or stand. 2. If directed, hold a __________ weight in your left / right hand, or hold an exercise band with both hands. Your palms should face up toward the ceiling at the starting position. 3. Bend your left / right elbow and move your hand up toward your shoulder. Keep your other arm straight down, in the starting position. 4. Slowly return to the starting position. Repeat __________ times. Complete this exercise __________ times a day. Exercise F: Elbow extension  1. Lie on your  back. 2. Hold a __________ weight in your left / right hand. 3. Bend your left / right elbow to an "L" shape (90 degrees) so your elbow is pointed up to the ceiling and the weight is overhead. 4. Straighten your elbow, raising your hand toward the ceiling. Use your other hand to support your left / right upper arm and to keep it still. 5. Slowly return to the starting position. Repeat __________ times. Complete this exercise __________ times a day. This information is not intended to replace advice given to you by your health care provider. Make sure you discuss any questions you have with your health care provider. Document Released: 05/25/2005 Document Revised: 01/30/2016 Document Reviewed: 05/03/2015 Elsevier Interactive Patient Education  [employer].

## 2017-11-29 ENCOUNTER — Ambulatory Visit (INDEPENDENT_AMBULATORY_CARE_PROVIDER_SITE_OTHER): Payer: 59 | Admitting: Physician Assistant

## 2017-11-29 ENCOUNTER — Encounter: Payer: Self-pay | Admitting: Physician Assistant

## 2017-11-29 ENCOUNTER — Other Ambulatory Visit (HOSPITAL_COMMUNITY)
Admission: RE | Admit: 2017-11-29 | Discharge: 2017-11-29 | Disposition: A | Discharge status: Home / Self Care | Payer: 59 | Source: Ambulatory Visit | Attending: Physician Assistant | Admitting: Physician Assistant

## 2017-11-29 VITALS — BP 110/77 | HR 86 | Wt 218.0 lb

## 2017-11-29 DIAGNOSIS — R102 Pelvic and perineal pain unspecified side: Secondary | ICD-10-CM

## 2017-11-29 DIAGNOSIS — R87810 Cervical high risk human papillomavirus (HPV) DNA test positive: Secondary | ICD-10-CM | POA: Insufficient documentation

## 2017-11-29 DIAGNOSIS — Z131 Encounter for screening for diabetes mellitus: Secondary | ICD-10-CM

## 2017-11-29 DIAGNOSIS — Z124 Encounter for screening for malignant neoplasm of cervix: Secondary | ICD-10-CM | POA: Diagnosis not present

## 2017-11-29 DIAGNOSIS — Z0000 Encounter for general adult medical examination without abnormal findings: Secondary | ICD-10-CM | POA: Diagnosis not present

## 2017-11-29 DIAGNOSIS — Z130 Encounter for screening for diseases of the blood and blood-forming organs and certain disorders involving the immune mechanism: Secondary | ICD-10-CM | POA: Diagnosis not present

## 2017-11-29 DIAGNOSIS — K429 Umbilical hernia without obstruction or gangrene: Secondary | ICD-10-CM | POA: Diagnosis not present

## 2017-11-29 DIAGNOSIS — E559 Vitamin D deficiency, unspecified: Secondary | ICD-10-CM

## 2017-11-29 DIAGNOSIS — E6609 Other obesity due to excess calories: Secondary | ICD-10-CM | POA: Insufficient documentation

## 2017-11-29 DIAGNOSIS — Z308 Encounter for other contraceptive management: Secondary | ICD-10-CM | POA: Insufficient documentation

## 2017-11-29 DIAGNOSIS — E66811 Obesity, class 1: Secondary | ICD-10-CM

## 2017-11-29 NOTE — Progress Notes (Signed)
HPI:                                                                Sarah Simmons is a 37 y.o. female who presents to [facility] today for annual physical exam with Pap  Current Concerns include: hernia  Small fat-containing umbilical hernia on CT April 2018. Reports periumbilical abdominal tenderness 1 week ago, worse with eating. Denies change in bowel habits  Endorses intermittent bilateral pelvic pain lasting days to 1 week associated with nausea. Denies abnormal uterine bleeding or dyspareunia. Symptoms have resolved spontaneously. Reports multiple negative home pregnancy tests.  GYN/Sexual Health  Obstetrics: G3P3  Menstrual status: having periods  LMP: last month, exact date unknown  Menses: regular  Last pap smear: yes,   History of abnormal pap smears:  Sexually active: yes  Current contraception: Essure  History of STI: no  Depression screen [facility] 2/9 11/29/2017 11/20/2016  Decreased Interest 0 0  Down, Depressed, Hopeless 1 0  PHQ - 2 Score 1 0    Health Maintenance Health Maintenance  Topic Date Due  . HIV Screening  08/19/1995  . PAP SMEAR  07/18/2016  . INFLUENZA VACCINE  01/06/2018  . TETANUS/TDAP  11/13/2018    Past Medical History:  Diagnosis Date  . Acoustic neuroma ([place])   . Anemia   . Nephrolithiasis    Past Surgical History:  Procedure Laterality Date  . ACOUSTIC NEUROMA RESECTION  12/14/2012   neuroma removal  . CHOLECYSTECTOMY  2010  . ESSURE TUBAL LIGATION    . EYE SURGERY     x3  . TUBAL LIGATION     Social History   Tobacco Use  . Smoking status: Never Smoker  . Smokeless tobacco: Never Used  Substance Use Topics  . Alcohol use: Yes    Alcohol/week: 0.0 oz    Comment: rarely   family history includes Cancer in her father; Hypothyroidism in her maternal grandmother; Liver cancer in her father; Transient ischemic attack in her maternal grandfather, paternal grandfather,  and unknown relative; Uterine cancer in her sister.  ROS: negative except as noted in the HPI No breast tenderness, lumps or nipple discharge No abnormal uterine bleeding  Medications: No current outpatient medications on file.   No current facility-administered medications for this visit.    No Known Allergies     Objective:  BP 110/77   Pulse 86   Wt 218 lb (98.9 kg)   BMI 32.19 kg/m  General Appearance:  Alert, cooperative, no distress, appropriate for age, obese female                            Head:  Normocephalic, without obvious abnormality                             Eyes:  conjunctiva and cornea clear, wearing glasses  Nose:  Nares symmetrical                                        Neck:  Supple; symmetrical, trachea midline, no adenopathy;                             Back:  Symmetrical, no curvature, ROM normal               Chest/Breast:  No tenderness, masses or nipple abnormality. No dimpling or discharge                           Lungs:  Clear to auscultation bilaterally, respirations unlabored                             Heart:  regular rate & normal rhythm, S1 and S2 normal, no murmurs, rubs, or gallops                     Abdomen:  Soft, non-tender, no mass or organomegaly              Genitourinary:  vulva without rashes or lesions, normal introitus and urethral meatus, vaginal mucosa without erythema, normal discharge, cervix slightly friable without lesions, adnexa without masses or tenderness, uterus non-tender and not enlarged         Musculoskeletal:  Tone and strength strong and symmetrical, all extremities; no joint pain or edema, normal gait and station                                   Lymphatic:  No adenopathy             Skin/Hair/Nails:  Skin warm, dry and intact, no rashes or abnormal dyspigmentation on limited exam                   Neurologic:  Alert and oriented x3, sensation grossly intact,  normal gait and station, no tremor Psych: well-groomed, cooperative, good eye contact, euthymic mood, affect mood-congruent, speech is articulate, and thought processes clear and goal-directed  A chaperone was used for the GU portion of the exam, Izell [place], CMA.    No results found for this or any previous visit (from the past 72 hour(s)). No results found.    Assessment and Plan: 37 y.o. female with   Encounter for Pap smear of cervix with HPV DNA cotesting - Plan: Cytology - PAP  Encounter for annual physical exam - Plan: CBC, Comprehensive metabolic panel, Lipid Panel w/reflex Direct LDL  Umbilical hernia without obstruction and without gangrene - Plan: Ambulatory referral to General Surgery  Vitamin D deficiency - Plan: VITAMIN D 25 Hydroxy (Vit-D Deficiency, Fractures)  Screening for blood disease - Plan: CBC, Comprehensive metabolic panel  Screening for diabetes mellitus - Plan: Comprehensive metabolic panel  Class 1 obesity due to excess calories without serious comorbidity in adult, unspecified BMI  - Personally reviewed PMH, PSH, PFH, medications, allergies, HM - Age-appropriate cancer screening: pap smear collected today - Influenza n/a - Tdap UTD - PHQ2 negative - Fasting labs pending - Counseled on weight loss through decreased caloric intake and increased aerobic exercise  Umbilical hernia -  no evidence of obstruction. Referring to Gen Surg due to tenderness  Pelvic pain - no abnormalities on prior CT abdomen/pelvis  - if symptoms persist or recur, will order pelvic/tv ultrasound   Patient education and anticipatory guidance given Patient agrees with treatment plan Follow-up based on Pap results or sooner as needed  Darlyne Russian PA-C

## 2017-11-29 NOTE — Patient Instructions (Addendum)
Umbilical Hernia, Adult A hernia is a bulge of tissue that pushes through an opening between muscles. An umbilical hernia happens in the abdomen, near the belly button (umbilicus). The hernia may contain tissues from the small intestine, large intestine, or fatty tissue covering the intestines (omentum). Umbilical hernias in adults tend to get worse over time, and they require surgical treatment. There are several types of umbilical hernias. You may have:  A hernia located just above or below the umbilicus (indirect hernia). This is the most common type of umbilical hernia in adults.  A hernia that forms through an opening formed by the umbilicus (direct hernia).  A hernia that comes and goes (reducible hernia). A reducible hernia may be visible only when you strain, lift something heavy, or cough. This type of hernia can be pushed back into the abdomen (reduced).  A hernia that traps abdominal tissue inside the hernia (incarcerated hernia). This type of hernia cannot be reduced.  A hernia that cuts off blood flow to the tissues inside the hernia (strangulated hernia). The tissues can start to die if this happens. This type of hernia requires emergency treatment.  What are the causes? An umbilical hernia happens when tissue inside the abdomen presses on a weak area of the abdominal muscles. What increases the risk? You may have a greater risk of this condition if you:  Are obese.  Have had several pregnancies.  Have a buildup of fluid inside your abdomen (ascites).  Have had surgery that weakens the abdominal muscles.  What are the signs or symptoms? The main symptom of this condition is a painless bulge at or near the belly button. A reducible hernia may be visible only when you strain, lift something heavy, or cough. Other symptoms may include:  Dull pain.  A feeling of pressure.  Symptoms of a strangulated hernia may include:  Pain that gets increasingly worse.  Nausea  and vomiting.  Pain when pressing on the hernia.  Skin over the hernia becoming red or purple.  Constipation.  Blood in the stool.  How is this diagnosed? This condition may be diagnosed based on:  A physical exam. You may be asked to cough or strain while standing. These actions increase the pressure inside your abdomen and force the hernia through the opening in your muscles. Your health care provider may try to reduce the hernia by pressing on it.  Your symptoms and medical history.  How is this treated? Surgery is the only treatment for an umbilical hernia. Surgery for a strangulated hernia is done as soon as possible. If you have a small hernia that is not incarcerated, you may need to lose weight before having surgery. Follow these instructions at home:  Lose weight, if told by your health care provider.  Do not try to push the hernia back in.  Watch your hernia for any changes in color or size. Tell your health care provider if any changes occur.  You may need to avoid activities that increase pressure on your hernia.  Do not lift anything that is heavier than 10 lb (4.5 kg) until your health care provider says that this is safe.  Take over-the-counter and prescription medicines only as told by your health care provider.  Keep all follow-up visits as told by your health care provider. This is important. Contact a health care provider if:  Your hernia gets larger.  Your hernia becomes painful. Get help right away if:  You develop sudden, severe pain near  the area of your hernia.  You have pain as well as nausea or vomiting.  You have pain and the skin over your hernia changes color.  You develop a fever. This information is not intended to replace advice given to you by your health care provider. Make sure you discuss any questions you have with your health care provider. Document Released: 10/25/2015 Document Revised: 01/26/2016 Document Reviewed:  10/25/2015 Elsevier Interactive Patient Education  2018 [place] 18-39 Years, Female Preventive care refers to lifestyle choices and visits with your health care provider that can promote health and wellness. What does preventive care include?  A yearly physical exam. This is also called an annual well check.  Dental exams once or twice a year.  Routine eye exams. Ask your health care provider how often you should have your eyes checked.  Personal lifestyle choices, including: ? Daily care of your teeth and gums. ? Regular physical activity. ? Eating a healthy diet. ? Avoiding tobacco and drug use. ? Limiting alcohol use. ? Practicing safe sex. ? Taking vitamin and mineral supplements as recommended by your health care provider. What happens during an annual well check? The services and screenings done by your health care provider during your annual well check will depend on your age, overall health, lifestyle risk factors, and family history of disease. Counseling Your health care provider may ask you questions about your:  Alcohol use.  Tobacco use.  Drug use.  Emotional well-being.  Home and relationship well-being.  Sexual activity.  Eating habits.  Work and work Statistician.  Method of birth control.  Menstrual cycle.  Pregnancy history.  Screening You may have the following tests or measurements:  Height, weight, and BMI.  Diabetes screening. This is done by checking your blood sugar (glucose) after you have not eaten for a while (fasting).  Blood pressure.  Lipid and cholesterol levels. These may be checked every 5 years starting at age 53.  Skin check.  Hepatitis C blood test.  Hepatitis B blood test.  Sexually transmitted disease (STD) testing.  BRCA-related cancer screening. This may be done if you have a family history of breast, ovarian, tubal, or peritoneal cancers.  Pelvic exam and Pap test. This may be done  every 3 years starting at age 46. Starting at age 62, this may be done every 5 years if you have a Pap test in combination with an HPV test.  Discuss your test results, treatment options, and if necessary, the need for more tests with your health care provider. Vaccines Your health care provider may recommend certain vaccines, such as:  Influenza vaccine. This is recommended every year.  Tetanus, diphtheria, and acellular pertussis (Tdap, Td) vaccine. You may need a Td booster every 10 years.  Varicella vaccine. You may need this if you have not been vaccinated.  HPV vaccine. If you are 9 or younger, you may need three doses over 6 months.  Measles, mumps, and rubella (MMR) vaccine. You may need at least one dose of MMR. You may also need a second dose.  Pneumococcal 13-valent conjugate (PCV13) vaccine. You may need this if you have certain conditions and were not previously vaccinated.  Pneumococcal polysaccharide (PPSV23) vaccine. You may need one or two doses if you smoke cigarettes or if you have certain conditions.  Meningococcal vaccine. One dose is recommended if you are age 63-21 years and a first-year college student living in a residence [place], or if you have one of  several medical conditions. You may also need additional booster doses.  Hepatitis A vaccine. You may need this if you have certain conditions or if you travel or work in places where you may be exposed to hepatitis A.  Hepatitis B vaccine. You may need this if you have certain conditions or if you travel or work in places where you may be exposed to hepatitis B.  Haemophilus influenzae type b (Hib) vaccine. You may need this if you have certain risk factors.  Talk to your health care provider about which screenings and vaccines you need and how often you need them. This information is not intended to replace advice given to you by your health care provider. Make sure you discuss any questions you have with your  health care provider. Document Released: 07/21/2001 Document Revised: 02/12/2016 Document Reviewed: 03/26/2015 Elsevier Interactive Patient Education  [employer].

## 2017-11-30 LAB — COMPREHENSIVE METABOLIC PANEL
AG Ratio: 1.4 (calc) (ref 1.0–2.5)
ALBUMIN MSPROF: 3.8 g/dL (ref 3.6–5.1)
ALT: 20 U/L (ref 6–29)
AST: 15 U/L (ref 10–30)
Alkaline phosphatase (APISO): 84 U/L (ref 33–115)
BUN: 15 mg/dL (ref 7–25)
CHLORIDE: 105 mmol/L (ref 98–110)
CO2: 27 mmol/L (ref 20–32)
CREATININE: 0.89 mg/dL (ref 0.50–1.10)
Calcium: 9.2 mg/dL (ref 8.6–10.2)
GLOBULIN: 2.8 g/dL (ref 1.9–3.7)
Glucose, Bld: 90 mg/dL (ref 65–99)
Potassium: 4.2 mmol/L (ref 3.5–5.3)
SODIUM: 139 mmol/L (ref 135–146)
TOTAL PROTEIN: 6.6 g/dL (ref 6.1–8.1)
Total Bilirubin: 0.6 mg/dL (ref 0.2–1.2)

## 2017-11-30 LAB — LIPID PANEL W/REFLEX DIRECT LDL
CHOL/HDL RATIO: 3.4 (calc) (ref <5.0)
Cholesterol: 170 mg/dL (ref <200)
HDL: 50 mg/dL — ABNORMAL LOW (ref >50)
LDL CHOLESTEROL (CALC): 96 mg/dL
NON-HDL CHOLESTEROL (CALC): 120 mg/dL (ref <130)
Triglycerides: 137 mg/dL (ref <150)

## 2017-11-30 LAB — CBC
HCT: 42.6 % (ref 35.0–45.0)
Hemoglobin: 13.7 g/dL (ref 11.7–15.5)
MCH: 26.8 pg — ABNORMAL LOW (ref 27.0–33.0)
MCHC: 32.2 g/dL (ref 32.0–36.0)
MCV: 83.2 fL (ref 80.0–100.0)
MPV: 10.9 fL (ref 7.5–12.5)
PLATELETS: 342 10*3/uL (ref 140–400)
RBC: 5.12 10*6/uL — AB (ref 3.80–5.10)
RDW: 12.8 % (ref 11.0–15.0)
WBC: 6.5 10*3/uL (ref 3.8–10.8)

## 2017-11-30 LAB — VITAMIN D 25 HYDROXY (VIT D DEFICIENCY, FRACTURES): VIT D 25 HYDROXY: 44 ng/mL (ref 30–100)

## 2017-12-01 ENCOUNTER — Encounter: Payer: Self-pay | Admitting: Physician Assistant

## 2017-12-01 ENCOUNTER — Encounter: Payer: 59 | Admitting: Physician Assistant

## 2017-12-01 DIAGNOSIS — R87820 Cervical low risk human papillomavirus (HPV) DNA test positive: Secondary | ICD-10-CM

## 2017-12-01 HISTORY — DX: Cervical low risk human papillomavirus (HPV) DNA test positive: R87.820

## 2017-12-01 LAB — CYTOLOGY - PAP
Diagnosis: NEGATIVE
HPV (WINDOPATH): DETECTED — AB
HPV 16/18/45 GENOTYPING: NEGATIVE

## 2017-12-01 NOTE — Progress Notes (Signed)
Hi [name],  Your Pap smear was normal, but your HPV test was positive. Thankfully the HPV is not one of the high risk strains. It is recommended to repeat your Pap smear with HPV test in 1 year.  Sarah Simmons

## 2017-12-14 ENCOUNTER — Ambulatory Visit: Payer: Self-pay | Admitting: Surgery

## 2017-12-14 ENCOUNTER — Encounter: Payer: Self-pay | Admitting: Surgery

## 2017-12-14 NOTE — H&P (Signed)
Alailah Safley Muthomi Documented: 12/14/2017 9:03 AM Location: [facility] Patient #: [id number] DOB: April 20, 1981 Single / Language: Cleophus Molt / Race: White Female  History of Present Illness Adin Hector MD; 12/14/2017 9:57 AM) The patient is a 37 year old female who presents with an umbilical hernia. Note for "Umbilical hernia": ` ` ` Patient sent for surgical consultation at the request of Alvera Singh, [place] [facility]  Chief Complaint: Umbilical hernia. ` ` The patient is a woman apparently had some abdominal and flank pain April 2018. Had CT scan that confirmed a small fat-containing umbilical hernia through the [facility] [place] system. Came in for follow-up with primary care office this year. Apparently the hernia is becoming more bothersome. Surgical consultation requested. She notes that the pain seems to be above her bellybutton. Worse when she eats. Sometimes radiates through to her back. Sometimes annoying with twisting and turning. She's case to get nauseated when she feels the pain in same time but has not thrown up. She's had some decreased appetite and unintentionally lost about 10 pounds. Does have a history of brain tumor that was removed in 2014. Also had a lap Cholecystectomy in 2008. That was done at the [facility] back when she lived in the [place]. [place] been a [place] for the past 4 years. 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 Does some mild to moderate activity. She can walk 20 minutes without difficulty. Moves her bowels every day.   No personal nor family history of GI/colon cancer, inflammatory bowel disease, irritable bowel syndrome, allergy such as Celiac Sprue, dietary/dairy problems, colitis, ulcers nor gastritis. No recent sick contacts/gastroenteritis. No travel outside the country. No changes in diet. No dysphagia to solids or liquids. No significant heartburn or reflux. No  hematochezia, hematemesis, coffee ground emesis. No evidence of prior gastric/peptic ulceration. Had kidney stones removed in the past. None seen on CT scan last year. No urinary tract infections. No episode of dark urine or light colored stools. No jaundice. Does not feel like she has biliary colic again. No history of liver or pancreas problems. Does not drink alcohol much.  (Review of systems as stated in this history (HPI) or in the review of systems. Otherwise all other 12 point ROS are negative) ` ` `   Past Surgical History (Tanisha A. Owens Shark, [place]; 12/14/2017 9:03 AM) Gallbladder Surgery - Laparoscopic  Diagnostic Studies History (Tanisha A. Owens Shark, [place]; 12/14/2017 9:03 AM) Colonoscopy never Mammogram 1-3 years ago Pap Smear 1-5 years ago  Allergies (Tanisha A. Owens Shark, [place]; 12/14/2017 9:04 AM) No Known Drug Allergies [12/14/2017]: Allergies Reconciled  Medication History (Tanisha A. Owens Shark, [place]; 12/14/2017 9:05 AM) Vitamin D (Oral) Specific strength unknown - Active. Multi-Vitamin (Oral) Active. Medications Reconciled  Social History Adin Hector, MD; 12/14/2017 9:31 AM) Alcohol use Occasional alcohol use. Caffeine use Tea. No drug use Tobacco use Never smoker. originally from [place]. Moved to [facility] 2015. 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  Family History (Tanisha A. Owens Shark, [place]; 12/14/2017 9:03 AM) Alcohol Abuse Brother. Arthritis Mother. Cancer Father, Sister. Cerebrovascular Accident Family Members In General. Heart Disease Family Members In General. Ischemic Bowel Disease Mother. Thyroid problems Sister.  Pregnancy / Birth History (Tanisha A. Owens Shark, [place]; 12/14/2017 9:03 AM) Age at menarche 68 years. Contraceptive History Intrauterine device. Gravida 3 Irregular periods Length (months) of breastfeeding 7-12 Maternal age 17-25 Para 43  Other Problems (Tanisha A. Owens Shark, [place];  12/14/2017 9:03  AM) Cholelithiasis Gastric Ulcer Kidney Stone Umbilical Hernia Repair     Review of Systems (Tanisha A. Brown RMA; 12/14/2017 9:03 AM) General Not Present- Appetite Loss, Chills, Fatigue, Fever, Night Sweats, Weight Gain and Weight Loss. Skin Not Present- Change in Wart/Mole, Dryness, Hives, Jaundice, New Lesions, Non-Healing Wounds, Rash and Ulcer. HEENT Present- Hearing Loss and Wears glasses/contact lenses. Not Present- Earache, Hoarseness, Nose Bleed, Oral Ulcers, Ringing in the Ears, Seasonal Allergies, Sinus Pain, Sore Throat, Visual Disturbances and Yellow Eyes. Respiratory Not Present- Bloody sputum, Chronic Cough, Difficulty Breathing, Snoring and Wheezing. Breast Not Present- Breast Mass, Breast Pain, Nipple Discharge and Skin Changes. Cardiovascular Not Present- Chest Pain, Difficulty Breathing Lying Down, Leg Cramps, Palpitations, Rapid Heart Rate, Shortness of Breath and Swelling of Extremities. Gastrointestinal Present- Abdominal Pain. Not Present- Bloating, Bloody Stool, Change in Bowel Habits, Chronic diarrhea, Constipation, Difficulty Swallowing, Excessive gas, Gets full quickly at meals, Hemorrhoids, Indigestion, Nausea, Rectal Pain and Vomiting. Female Genitourinary Present- Pelvic Pain. Not Present- Frequency, Nocturia, Painful Urination and Urgency. Musculoskeletal Present- Back Pain. Not Present- Joint Pain, Joint Stiffness, Muscle Pain, Muscle Weakness and Swelling of Extremities. Neurological Not Present- Decreased Memory, Fainting, Headaches, Numbness, Seizures, Tingling, Tremor, Trouble walking and Weakness. Psychiatric Not Present- Anxiety, Bipolar, Change in Sleep Pattern, Depression, Fearful and Frequent crying. Endocrine Not Present- Cold Intolerance, Excessive Hunger, Hair Changes, Heat Intolerance, Hot flashes and New Diabetes. Hematology Not Present- Blood Thinners, Easy Bruising, Excessive bleeding, Gland problems, HIV and Persistent Infections.  Vitals  (Tanisha A. Brown RMA; 12/14/2017 9:04 AM) 12/14/2017 9:04 AM Weight: 217.6 lb Height: 69in Body Surface Area: 2.14 m Body Mass Index: 32.13 kg/m  Temp.: 54F  Pulse: 88 (Regular)  BP: 114/76 (Sitting, Left Arm, Standard)      Physical Exam Adin Hector MD; 12/14/2017 9:27 AM)  General Mental Status-Alert. General Appearance-Not in acute distress, Not Sickly. Orientation-Oriented X3. Hydration-Well hydrated. Voice-Normal.  Integumentary Global Assessment Upon inspection and palpation of skin surfaces of the - Axillae: non-tender, no inflammation or ulceration, no drainage. and Distribution of scalp and body hair is normal. General Characteristics Temperature - normal warmth is noted.  Head and Neck Head-normocephalic, atraumatic with no lesions or palpable masses. Face Global Assessment - atraumatic, no absence of expression. Neck Global Assessment - no abnormal movements, no bruit auscultated on the right, no bruit auscultated on the left, no decreased range of motion, non-tender. Trachea-midline. Thyroid Gland Characteristics - non-tender.  Eye Eyeball - Left-Extraocular movements intact, No Nystagmus. Eyeball - Right-Extraocular movements intact, No Nystagmus. Cornea - Left-No Hazy. Cornea - Right-No Hazy. Sclera/Conjunctiva - Left-No scleral icterus, No Discharge. Sclera/Conjunctiva - Right-No scleral icterus, No Discharge. Pupil - Left-Direct reaction to light normal. Pupil - Right-Direct reaction to light normal. Note: Wears glasses. Vision corrected  ENMT Ears Pinna - Left - no drainage observed, no generalized tenderness observed. Right - no drainage observed, no generalized tenderness observed. Nose and Sinuses External Inspection of the Nose - no destructive lesion observed. Inspection of the nares - Left - quiet respiration. Right - quiet respiration. Mouth and Throat Lips - Upper Lip - no fissures observed, no  pallor noted. Lower Lip - no fissures observed, no pallor noted. Nasopharynx - no discharge present. Oral Cavity/Oropharynx - Tongue - no dryness observed. Oral Mucosa - no cyanosis observed. Hypopharynx - no evidence of airway distress observed.  Chest and Lung Exam Inspection Movements - Normal and Symmetrical. Accessory muscles - No use of accessory muscles in breathing. Palpation Palpation  of the chest reveals - Non-tender. Auscultation Breath sounds - Normal and Clear.  Cardiovascular Auscultation Rhythm - Regular. Murmurs & Other Heart Sounds - Auscultation of the heart reveals - No Murmurs and No Systolic Clicks.  Abdomen Inspection Inspection of the abdomen reveals - No Visible peristalsis and No Abnormal pulsations. Umbilicus - No Bleeding, No Urine drainage. Palpation/Percussion Palpation and Percussion of the abdomen reveal - Soft, Non Tender, No Rebound tenderness, No Rigidity (guarding) and No Cutaneous hyperesthesia. Note: Small umbilical hernia with some pain at the stalk. Or discomfort in the epigastric almost subxiphoid region slightly left of midline but no definite hernia felt. Suspicious though. Abdomen soft. Not severely distended. No distasis recti. No umbilical or other anterior abdominal wall hernias  Female Genitourinary Sexual Maturity Tanner 5 - Adult hair pattern. Note: No vaginal bleeding nor discharge  Peripheral Vascular Upper Extremity Inspection - Left - No Cyanotic nailbeds, Not Ischemic. Right - No Cyanotic nailbeds, Not Ischemic.  Neurologic Neurologic evaluation reveals -normal attention span and ability to concentrate, able to name objects and repeat phrases. Appropriate fund of knowledge , normal sensation and normal coordination. Mental Status Affect - not angry, not paranoid. Cranial Nerves-Normal Bilaterally. Gait-Normal.  Neuropsychiatric Mental status exam performed with findings of-able to articulate well with normal  speech/language, rate, volume and coherence, thought content normal with ability to perform basic computations and apply abstract reasoning and no evidence of hallucinations, delusions, obsessions or homicidal/suicidal ideation.  Musculoskeletal Global Assessment Spine, Ribs and Pelvis - no instability, subluxation or laxity. Right Upper Extremity - no instability, subluxation or laxity.  Lymphatic Head & Neck  General Head & Neck Lymphatics: Bilateral - Description - No Localized lymphadenopathy. Axillary  General Axillary Region: Bilateral - Description - No Localized lymphadenopathy. Femoral & Inguinal  Generalized Femoral & Inguinal Lymphatics: Left - Description - No Localized lymphadenopathy. Right - Description - No Localized lymphadenopathy.    Assessment & Plan Adin Hector MD; 12/14/2017 9:30 AM)  Fatima Blank HERNIA, WITHOUT OBSTRUCTION OR GANGRENE (K43.2) Impression: Small hernia at bellybutton most likely incisional from her prior laparoscopic cholecystectomy.  I think she would benefit from surgical repair. Given the fact that her discomfort is seems slightly above this, I wonder if if it is referred or if she actually has she has another epigastric hernia. I would do diagnostic laparoscopy to take down the falciform ligament a look at the region. Would plan to do underlay mesh repair of the region to help cover any potential regions. She is interested in proceeding.  I did note that another option is to see gastroenterology to see if she would benefit from upper endoscopy. Her symptoms seem be triggered more with eating then intense activity, but the pain is focal in the periumbilical region. She has already had her gallbladder out. She does not give me a good story for heartburn or reflux or ulcer issues. Gastrointestinal review of systems is underwhelming. The didn't note she can do a trial of over-the-counter Prilosec twice a day for 3 weeks. That should help resolve any  major gastric or heartburn issues. She is skeptical whether that will help much.   EPIGASTRIC HERNIA (K43.9) Impression: Discomfort a little superior to the umbilicus. I wonder if she has no epigastric hernia. As noted above, we'll plan diagnostic laparoscopy and repair of many hernias found. Most likely use mesh to help cover the subxiphoid region just case. This will allow me to do a diagnostic laparoscopy to rule out other potential problems.  PREOP - [facility] - ENCOUNTER FOR PREOPERATIVE EXAMINATION FOR GENERAL SURGICAL PROCEDURE (Z01.818)  Current Plans You are being scheduled for surgery- Our schedulers will call you.  You should hear from our office's scheduling department within 5 working days about the location, date, and time of surgery. We try to make accommodations for patient's preferences in scheduling surgery, but sometimes the OR schedule or the surgeon's schedule prevents [place] from making those accommodations.  If you have not heard from our office [phone]) in 5 working days, call the office and ask for your surgeon's nurse.  If you have other questions about your diagnosis, plan, or surgery, call the office and ask for your surgeon's nurse.  Written instructions provided The anatomy & physiology of the abdominal wall was discussed. The pathophysiology of hernias was discussed. Natural history risks without surgery including progeressive enlargement, pain, incarceration, & strangulation was discussed. Contributors to complications such as smoking, obesity, diabetes, prior surgery, etc were discussed.  I feel the risks of no intervention will lead to serious problems that outweigh the operative risks; therefore, I recommended surgery to reduce and repair the hernia. I explained laparoscopic techniques with possible need for an open approach. I noted the probable use of mesh to patch and/or buttress the hernia repair  Risks such as bleeding, infection, abscess, need for  further treatment, heart attack, death, and other risks were discussed. I noted a good likelihood this will help address the problem. Goals of post-operative recovery were discussed as well. Possibility that this will not correct all symptoms was explained. I stressed the importance of low-impact activity, aggressive pain control, avoiding constipation, & not pushing through pain to minimize risk of post-operative chronic pain or injury. Possibility of reherniation especially with smoking, obesity, diabetes, immunosuppression, and other health conditions was discussed. We will work to minimize complications.  An educational handout further explaining the pathology & treatment options was given as well. Questions were answered. The patient expresses understanding & wishes to proceed with surgery.  Pt Education - [facility] Hernia Post-Op HCI (Miah Boye): discussed with patient and provided information. Pt Education - [facility] Pain Control (Matty Deamer) Pt Education - Pamphlet Given - Laparoscopic Hernia Repair: discussed with patient and provided information. Pt Education - [facility] Mesh education: discussed with patient and provided information.  Adin Hector, MD, FACS, MASCRS Gastrointestinal and Minimally Invasive Surgery    [address], [place], [place] [address] [phone] Main / Paging [phone] Fax

## 2017-12-19 ENCOUNTER — Encounter: Payer: Self-pay | Admitting: Emergency Medicine

## 2017-12-19 ENCOUNTER — Emergency Department (INDEPENDENT_AMBULATORY_CARE_PROVIDER_SITE_OTHER)
Admission: EM | Admit: 2017-12-19 | Discharge: 2017-12-19 | Disposition: A | Discharge status: Home / Self Care | Payer: 59 | Source: Home / Self Care

## 2017-12-19 DIAGNOSIS — R1084 Generalized abdominal pain: Secondary | ICD-10-CM

## 2017-12-19 LAB — POCT URINALYSIS DIP (MANUAL ENTRY)
BILIRUBIN UA: NEGATIVE
BILIRUBIN UA: NEGATIVE mg/dL
Blood, UA: NEGATIVE
Glucose, UA: NEGATIVE mg/dL
Nitrite, UA: NEGATIVE
PH UA: 5.5 (ref 5.0–8.0)
PROTEIN UA: NEGATIVE mg/dL
Spec Grav, UA: >=1.03 — AB (ref 1.010–1.025)
Urobilinogen, UA: 0.2 E.U./dL

## 2017-12-19 LAB — POCT URINE PREGNANCY: Preg Test, Ur: NEGATIVE

## 2017-12-19 NOTE — ED Provider Notes (Addendum)
[facility]    CSN: [id number] Arrival date & time: 12/19/17  1317     History   Chief Complaint Chief Complaint  Patient presents with  . Dizziness  . Near Syncope    HPI Sarah Simmons is a 37 y.o. female.   The history is provided by the patient. No language interpreter was used.  Dizziness  Quality:  Head spinning Severity:  Moderate Onset quality:  Gradual Duration:  1 day Timing:  Constant Progression:  Worsening Chronicity:  New Relieved by:  Nothing Worsened by:  Nothing Ineffective treatments:  None tried Associated symptoms: nausea, shortness of breath and weakness   Risk factors: no anemia   Near Syncope  Associated symptoms include abdominal pain and shortness of breath.  Pt reports she felt light headed and dizzy at church while singing.  Pt reports she felt short of breath.    Pt reports she has had abdominal pain for 3 weeks.  Pt sqw surgeon who told her she has a hernia.  Pt reports she would like to have imaging.  Past Medical History:  Diagnosis Date  . Acoustic neuroma ([place])   . Anemia   . Nephrolithiasis   . Papanicolaou smear of cervix with low risk human papillomavirus (HPV) DNA test positive 12/01/2017   Repeat Pap June 2020    Patient Active Problem List   Diagnosis Date Noted  . Papanicolaou smear of cervix with low risk human papillomavirus (HPV) DNA test positive 12/01/2017  . Class 1 obesity due to excess calories without serious comorbidity in adult 11/29/2017  . Umbilical hernia without obstruction and without gangrene 11/29/2017  . Vitamin D deficiency 11/29/2017  . Encounter for post Essure sterilization check 11/29/2017  . Tenderness of right axilla 11/20/2016  . Axillary tenderness, left 11/20/2016  . Fatigue 11/20/2016  . Unintentional weight loss 11/20/2016  . Deaf, right 01/14/2016  . Right-sided vestibular weakness 01/14/2016  . 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 Chest wall pain 12/04/2014  .  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 Hair loss 03/20/2014  . Right facial numbness 03/20/2014  . Right-sided sensorineural hearing loss 03/20/2014    Past Surgical History:  Procedure Laterality Date  . ACOUSTIC NEUROMA RESECTION  12/14/2012   neuroma removal  . CHOLECYSTECTOMY  2010  . ESSURE TUBAL LIGATION    . EYE SURGERY     x3  . TUBAL LIGATION      OB History    Gravida  3   Para  3   Term      Preterm      AB      Living        SAB      TAB      Ectopic      Multiple      Live Births               Home Medications    Prior to Admission medications   Not on File    Family History Family History  Problem Relation Age of Onset  . Uterine cancer Sister   . Liver cancer Father   . Cancer Father   . Transient ischemic attack Unknown        grandparents  . Hypothyroidism Maternal Grandmother   . Transient ischemic attack Maternal Grandfather   . Transient ischemic attack Paternal Grandfather     Social History Social History   Tobacco Use  . Smoking status: Never  Smoker  . Smokeless tobacco: Never Used  Substance Use Topics  . Alcohol use: Yes    Alcohol/week: 0.0 oz    Comment: rarely  . Drug use: No     Allergies   Chocolate   Review of Systems Review of Systems  Respiratory: Positive for shortness of breath.   Cardiovascular: Positive for near-syncope.  Gastrointestinal: Positive for abdominal pain and nausea.  Neurological: Positive for dizziness and weakness.  All other systems reviewed and are negative.    Physical Exam Triage Vital Signs ED Triage Vitals  Enc Vitals Group     BP 12/19/17 1400 (!) 136/94     Pulse Rate 12/19/17 1400 97     Resp 12/19/17 1400 16     Temp 12/19/17 1400 98.5 F (36.9 C)     Temp Source 12/19/17 1400 Oral     SpO2 12/19/17 1400 100 %     Weight 12/19/17 1402 216 lb (98 kg)     Height 12/19/17 1402 5\' 9"  (1.753 m)     Head Circumference --      Peak  Flow --      Pain Score 12/19/17 1401 4     Pain Loc --      Pain Edu? --      Excl. in [place]? --    No data found.  Updated Vital Signs BP (!) 136/94 (BP Location: Right Arm)   Pulse 97   Temp 98.5 F (36.9 C) (Oral)   Resp 16   Ht 5\' 9"  (1.753 m)   Wt 216 lb (98 kg)   SpO2 100%   BMI 31.90 kg/m   Visual Acuity Right Eye Distance:   Left Eye Distance:   Bilateral Distance:    Right Eye Near:   Left Eye Near:    Bilateral Near:     Physical Exam  Constitutional: She is oriented to person, place, and time. She appears well-developed and well-nourished.  HENT:  Head: Normocephalic.  Right Ear: External ear normal.  Left Ear: External ear normal.  Eyes: Pupils are equal, round, and reactive to light. EOM are normal.  Neck: Normal range of motion.  Cardiovascular: Normal rate and regular rhythm.  No protrusion of hernia,  Pt seems to have a wall deficit above the umbilicus   Pulmonary/Chest: Effort normal.  Abdominal: Soft. She exhibits no distension.  Musculoskeletal: Normal range of motion.  Neurological: She is alert and oriented to person, place, and time.  Skin: Skin is warm.  Psychiatric: She has a normal mood and affect.  Nursing note and vitals reviewed.    UC Treatments / Results  Labs (all labs ordered are listed, but only abnormal results are displayed) Labs Reviewed  POCT URINALYSIS DIP (MANUAL ENTRY) - Abnormal; Notable for the following components:      Result Value   Spec Grav, UA >=1.030 (*)    Leukocytes, UA Trace (*)    All other components within normal limits  POCT URINE PREGNANCY    EKG None  Radiology No results found.  Procedures Procedures (including critical care time)  Medications Ordered in UC Medications - No data to display  Initial Impression / Assessment and Plan / UC Course  I have reviewed the triage vital signs and the nursing notes.  Pertinent labs & imaging results that were available during my care of the  patient were reviewed by me and considered in my medical decision making (see chart for details).    MDM  Pt  advised to contact her primary care for evaluation /request ct scan  Final Clinical Impressions(s) / UC Diagnoses   Final diagnoses:  Generalized abdominal pain   Discharge Instructions   None    ED Prescriptions    None     Controlled Substance Prescriptions [place] Controlled Substance Registry consulted? Not Applicable   Sidney Ace 12/19/17 1450    [address], [place] 12/20/17 [phone]

## 2017-12-19 NOTE — ED Triage Notes (Signed)
Patient presents to [facility] with C/O upper mid abd pain which radiates through to the mid back.C/O nausea without vomiting. She has had GB removed, states she has a hernia and went to see a Psychologist, sport and exercise after seeing the surgeon she states thing got worse. This morning in church she states that she had the same type of pain, felt SOB and dizziness and nearly passed out.

## 2017-12-19 NOTE — Discharge Instructions (Addendum)
Return if any problems.

## 2017-12-20 ENCOUNTER — Telehealth: Payer: Self-pay | Admitting: Physician Assistant

## 2017-12-20 NOTE — Telephone Encounter (Signed)
Pt requested a CT scan regarding her visit to the urgent care on 12/19/17. Please advise

## 2017-12-21 ENCOUNTER — Telehealth: Payer: Self-pay

## 2017-12-21 NOTE — Telephone Encounter (Signed)
-----   Message from Fransico Meadow, PA-C sent at 12/19/2017  2:51 PM EDT ----- Sarah Simmons,    I saw this pt yesterday at Urgent care.  She has had 3 weeks of abdominal pain.  She has seen a Psychologist, sport and exercise.  She has a hernia.  She wants/needs a ct scan of her abdomen.  (I don't have today and she doesn't need to go to ED)  Can you contact her/set her up/see her... However you can best handle.   Thanks a bunch   Alyse Low

## 2017-12-21 NOTE — Telephone Encounter (Signed)
Pt is following up with PCP for CT.

## 2017-12-21 NOTE — Telephone Encounter (Signed)
The surgeon will not do the CT because the area of the pain is not coming from the location of the hernia. I advised patient Sarah Simmons is out of the office until tomorrow. Advised patient if pain was worse to go to the ED. No openings tomorrow with Sarah Simmons. Please advise.

## 2017-12-21 NOTE — Telephone Encounter (Signed)
Sarah Simmons, I received a message that patient wants a CT of her abdomen. This is something that should be ordered by her general surgeon because they will need to review the results pre-operatively and are the ones managing her hernia. Please ask if she has contacted them regarding this and if not, encourage her to do so. Thanks!

## 2017-12-22 NOTE — Telephone Encounter (Signed)
Just get her on someone's schedule this week for an acute. We can't order the CT without seeing her and having documentation The ER did not feel it was emergent

## 2017-12-23 ENCOUNTER — Encounter: Payer: Self-pay | Admitting: Physician Assistant

## 2017-12-23 ENCOUNTER — Ambulatory Visit (INDEPENDENT_AMBULATORY_CARE_PROVIDER_SITE_OTHER): Payer: 59 | Admitting: Physician Assistant

## 2017-12-23 VITALS — BP 115/80 | HR 86 | Temp 98.1°F | Wt 218.0 lb

## 2017-12-23 DIAGNOSIS — R1033 Periumbilical pain: Secondary | ICD-10-CM | POA: Diagnosis not present

## 2017-12-23 DIAGNOSIS — R82998 Other abnormal findings in urine: Secondary | ICD-10-CM | POA: Diagnosis not present

## 2017-12-23 LAB — POCT URINALYSIS DIPSTICK
BILIRUBIN UA: NEGATIVE
Glucose, UA: NEGATIVE
KETONES UA: 15
Nitrite, UA: NEGATIVE
PROTEIN UA: NEGATIVE
Spec Grav, UA: >=1.03 — AB (ref 1.010–1.025)
Urobilinogen, UA: 0.2 E.U./dL
pH, UA: 5.5 (ref 5.0–8.0)

## 2017-12-23 LAB — POCT URINE PREGNANCY: Preg Test, Ur: NEGATIVE

## 2017-12-23 MED ORDER — ESOMEPRAZOLE MAGNESIUM 40 MG PO CPDR: 40.0000 mg | DELAYED_RELEASE_CAPSULE | Freq: Every day | ORAL | 0 refills | Status: DC

## 2017-12-23 NOTE — Patient Instructions (Signed)
Food Choices for Peptic Ulcer Disease When you have peptic ulcer disease, the foods you eat and your eating habits are very important. Choosing the right foods can help ease the discomfort of peptic ulcer disease. What general guidelines do I need to follow?  Choose fruits, vegetables, whole grains, and low-fat meat, fish, and poultry.  Keep a food diary to identify foods that cause symptoms.  Avoid foods that cause irritation or pain. These may be different for different people.  Eat frequent small meals instead of three large meals each day. The pain may be worse when your stomach is empty.  Avoid eating close to bedtime. What foods are not recommended? The following are some foods and drinks that may worsen your symptoms:  Black, white, and red pepper.  Hot sauce.  Chili peppers.  Chili powder.  Chocolate and cocoa.  Alcohol.  Tea, coffee, and cola (regular and decaffeinated).  The items listed above may not be a complete list of foods and beverages to avoid. Contact your dietitian for more information. This information is not intended to replace advice given to you by your health care provider. Make sure you discuss any questions you have with your health care provider. Document Released: 08/17/2011 Document Revised: 10/31/2015 Document Reviewed: 03/29/2013 Elsevier Interactive Patient Education  2017 [employer].

## 2017-12-23 NOTE — Progress Notes (Signed)
HPI:                                                                Sarah Simmons is a 37 y.o. female who presents to [place]: [facility] today for abdominal pain  Pleasant 37 yo F with PMH nephrolithiasis, PUD, umbilical hernia, s/p cholecystectomy, obesity who presents with worsening upper abdominal pain for approximately 1 month. Pain is located just above the umbilicus and radiates to the back. Described as occasionally sharp, waxes and wanes, associated with nausea and early satiety, eating makes it worse. She was evaluated by General Surgery for a small fat-containing umbilical hernia and they did not feel this was the pain generator. Denies consitutional symptoms, dysphagia, forceful vomiting, change in bowel habits, hematochezia, melena. Denies dysuria, urgency, frequency, hematuria.  Depression screen [facility] 2/9 11/29/2017 11/20/2016  Decreased Interest 0 0  Down, Depressed, Hopeless 1 0  PHQ - 2 Score 1 0    No flowsheet data found.    Past Medical History:  Diagnosis Date  . Acoustic neuroma ([place])   . Anemia   . Nephrolithiasis   . Papanicolaou smear of cervix with low risk human papillomavirus (HPV) DNA test positive 12/01/2017   Repeat Pap June 2020   Past Surgical History:  Procedure Laterality Date  . ACOUSTIC NEUROMA RESECTION  12/14/2012   neuroma removal  . CHOLECYSTECTOMY  2010  . ESSURE TUBAL LIGATION    . EYE SURGERY     x3  . TUBAL LIGATION     Social History   Tobacco Use  . Smoking status: Never Smoker  . Smokeless tobacco: Never Used  Substance Use Topics  . Alcohol use: Yes    Alcohol/week: 0.0 oz    Comment: rarely   family history includes Cancer in her father; Hypothyroidism in her maternal grandmother; Liver cancer in her father; Transient ischemic attack in her maternal grandfather, paternal grandfather, and unknown relative; Uterine cancer in her sister.    ROS: negative except as noted in the  HPI  Medications: Current Outpatient Medications  Medication Sig Dispense Refill  . esomeprazole (NEXIUM) 40 MG capsule Take 1 capsule (40 mg total) by mouth daily at 12 noon. 30 capsule 0   No current facility-administered medications for this visit.    Allergies  Allergen Reactions  . Chocolate        Objective:  BP 115/80   Pulse 86   Temp 98.1 F (36.7 C) (Oral)   Wt 218 lb (98.9 kg)   LMP 12/20/2017 (Exact Date)   BMI 32.19 kg/m  Gen:  alert, not ill-appearing, no distress, appropriate for age, obese female HEENT: head normocephalic without obvious abnormality, conjunctiva and cornea clear, wearing glasses, trachea midline Pulm: Normal work of breathing, normal phonation, clear to auscultation bilaterally, no wheezes, rales or rhonchi CV: Normal rate, regular rhythm, s1 and s2 distinct, no murmurs, clicks or rubs  GI: abdomen soft, non-distended, there is tenderness approx 1 inch above the umbilical without guarding, no palpable masses Neuro: alert and oriented x 3, no tremor MSK: extremities atraumatic, normal gait and station Skin: intact, no rashes on exposed skin, no jaundice, no cyanosis Psych: well-groomed, cooperative, good eye contact, euthymic mood, affect mood-congruent, speech is articulate, and  thought processes clear and goal-directed    Results for orders placed or performed in visit on 12/23/17 (from the past 72 hour(s))  POCT Urinalysis Dipstick     Status: Abnormal   Collection Time: 12/23/17  4:33 PM  Result Value Ref Range   Color, UA dark yellow    Clarity, UA clear    Glucose, UA Negative Negative   Bilirubin, UA negative    Ketones, UA 15    Spec Grav, UA >=1.030 (A) 1.010 - 1.025   Blood, UA moderate    pH, UA 5.5 5.0 - 8.0   Protein, UA Negative Negative   Urobilinogen, UA 0.2 0.2 or 1.0 E.U./dL   Nitrite, UA negative    Leukocytes, UA Trace (A) Negative   Appearance     Odor    POCT urine pregnancy     Status: Normal   Collection  Time: 12/23/17  4:33 PM  Result Value Ref Range   Preg Test, Ur Negative Negative   No results found.    Assessment and Plan: 37 y.o. female with   Acute periumbilical pain - Plan: POCT Urinalysis Dipstick, POCT urine pregnancy, Urine Culture, CBC with Differential/Platelet, Comprehensive metabolic panel, Lipase, CT Abdomen Pelvis W Contrast, esomeprazole (NEXIUM) 40 MG capsule, Ambulatory referral to Gastroenterology  Urine leukocytes  - afebrile, no tachycardia, benign abdominal exam. Given history of hernia and persistent symptoms>4 weeks, will obtain CT Abdomen/Pelvis. Her HPI and physical exam is consistent with gastritis, peptic or duodenal ulcer. Starting Nexium 40 mg daily, GERD diet. Referring to GI - CMP, CBC, lipase pending - UA positive for trace leuks, awaiting culture - Upreg negative   Patient education and anticipatory guidance given Patient agrees with treatment plan Follow-up as needed if symptoms worsen or fail to improve  Darlyne Russian PA-C

## 2017-12-24 ENCOUNTER — Ambulatory Visit (INDEPENDENT_AMBULATORY_CARE_PROVIDER_SITE_OTHER): Payer: 59

## 2017-12-24 ENCOUNTER — Encounter: Payer: Self-pay | Admitting: Physician Assistant

## 2017-12-24 ENCOUNTER — Other Ambulatory Visit: Payer: Self-pay | Admitting: Physician Assistant

## 2017-12-24 DIAGNOSIS — R1013 Epigastric pain: Secondary | ICD-10-CM

## 2017-12-24 DIAGNOSIS — R1033 Periumbilical pain: Secondary | ICD-10-CM | POA: Insufficient documentation

## 2017-12-24 DIAGNOSIS — Z9049 Acquired absence of other specified parts of digestive tract: Secondary | ICD-10-CM | POA: Diagnosis not present

## 2017-12-24 LAB — COMPREHENSIVE METABOLIC PANEL

## 2017-12-24 LAB — URINE CULTURE
MICRO NUMBER:: 90852778
SPECIMEN QUALITY: ADEQUATE

## 2017-12-24 LAB — CBC WITH DIFFERENTIAL/PLATELET

## 2017-12-24 MED ORDER — IOPAMIDOL (ISOVUE-300) INJECTION 61%
100.0000 mL | Freq: Once | INTRAVENOUS | Status: AC | PRN
  Administered 2017-12-24: 100 mL via INTRAVENOUS

## 2017-12-24 NOTE — Telephone Encounter (Signed)
CT is being done today.

## 2017-12-24 NOTE — Progress Notes (Signed)
Hi Sarah Simmons,  As discussed on the phone, your CT scan was reassuring 1. There is a small 1.5 cm x 9 mm defect in the abdominal wall near the umbilicus. Radiologist did not feel this was a true umbilical hernia 2. There is a stable calcified granuloma in your right lower lung 3. There is a tiny 8 mm structure on the liver that is likely a cyst  No abnormality to explain your present abdominal pain. Continue the Nexium daily and I am going to refer you to [facility].  Evlyn Clines

## 2017-12-25 LAB — LIPASE: Lipase: 23 U/L (ref 7–60)

## 2017-12-25 LAB — URINE CULTURE
MICRO NUMBER:: 90859955
SPECIMEN QUALITY: ADEQUATE

## 2017-12-25 LAB — COMPREHENSIVE METABOLIC PANEL
AG Ratio: 1.2 (calc) (ref 1.0–2.5)
ALT: 12 U/L (ref 6–29)
AST: 12 U/L (ref 10–30)
Albumin: 3.6 g/dL (ref 3.6–5.1)
Alkaline phosphatase (APISO): 79 U/L (ref 33–115)
BILIRUBIN TOTAL: 0.5 mg/dL (ref 0.2–1.2)
BUN: 13 mg/dL (ref 7–25)
CALCIUM: 9.1 mg/dL (ref 8.6–10.2)
CO2: 27 mmol/L (ref 20–32)
Chloride: 105 mmol/L (ref 98–110)
Creat: 0.83 mg/dL (ref 0.50–1.10)
GLUCOSE: 98 mg/dL (ref 65–99)
Globulin: 3 g/dL (calc) (ref 1.9–3.7)
Potassium: 4.1 mmol/L (ref 3.5–5.3)
SODIUM: 138 mmol/L (ref 135–146)
Total Protein: 6.6 g/dL (ref 6.1–8.1)

## 2017-12-25 LAB — CBC WITH DIFFERENTIAL/PLATELET
BASOS ABS: 37 {cells}/uL (ref 0–200)
Basophils Relative: 0.6 %
EOS ABS: 192 {cells}/uL (ref 15–500)
EOS PCT: 3.1 %
HEMATOCRIT: 41.7 % (ref 35.0–45.0)
HEMOGLOBIN: 13.5 g/dL (ref 11.7–15.5)
Lymphs Abs: 2065 cells/uL (ref 850–3900)
MCH: 27.1 pg (ref 27.0–33.0)
MCHC: 32.4 g/dL (ref 32.0–36.0)
MCV: 83.7 fL (ref 80.0–100.0)
MPV: 10.6 fL (ref 7.5–12.5)
Monocytes Relative: 7.6 %
NEUTROS ABS: 3435 {cells}/uL (ref 1500–7800)
Neutrophils Relative %: 55.4 %
Platelets: 390 10*3/uL (ref 140–400)
RBC: 4.98 10*6/uL (ref 3.80–5.10)
RDW: 12.9 % (ref 11.0–15.0)
Total Lymphocyte: 33.3 %
WBC mixed population: 471 cells/uL (ref 200–950)
WBC: 6.2 10*3/uL (ref 3.8–10.8)

## 2017-12-29 ENCOUNTER — Encounter: Payer: Self-pay | Admitting: Gastroenterology

## 2017-12-29 ENCOUNTER — Ambulatory Visit (INDEPENDENT_AMBULATORY_CARE_PROVIDER_SITE_OTHER): Payer: 59 | Admitting: Gastroenterology

## 2017-12-29 VITALS — BP 108/64 | HR 62 | Ht 69.0 in | Wt 216.4 lb

## 2017-12-29 DIAGNOSIS — R1013 Epigastric pain: Secondary | ICD-10-CM | POA: Diagnosis not present

## 2017-12-29 DIAGNOSIS — R6881 Early satiety: Secondary | ICD-10-CM

## 2017-12-29 DIAGNOSIS — R110 Nausea: Secondary | ICD-10-CM | POA: Diagnosis not present

## 2017-12-29 DIAGNOSIS — K769 Liver disease, unspecified: Secondary | ICD-10-CM | POA: Diagnosis not present

## 2017-12-29 NOTE — Progress Notes (Signed)
[facility] Consult Note:  History: Sarah Simmons 12/29/2017  Referring physician: Trixie Dredge, PA-C  Reason for consult/chief complaint: Nausea (x 3 weeks, worse after meals ) and Abdominal Pain (sharp periumbilical pain before meals and burning sensation after meals, pt states the pain radiates her back. 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  About 3 weeks ago it started with nausea but then progressed to a burning and sharp epigastric pain that is usually worse before meals and relieved somewhat afterwards.  Lately it is also been radiating through to the back and occurring every day.  She went to urgent care recently and primary care last week.  CT abdomen pelvis was performed with results below.  She reports is seeing Sarah Simmons from general surgery who apparently felt that her umbilical hernia was not the source of this pain and tenderness. The nausea has subsided, there is no vomiting, no recent weight loss.  Her bowel habits are regular without rectal bleeding.  Sarah Simmons also describes similar pain requiring cholecyst[place]  However, she continued to have pain after cholecystectomy, and was taking NSAIDs for it.  She then had an upper endoscopy and what sounds like ERCP, reportedly discovering an ulcer that have been attributed to the NSAIDs and removing one or more stones from the bile duct.  Nexium started last week by primary care has not improved the pain. Well-appearing woman, no acute distress ROS:  Review of Systems  Constitutional: Negative for appetite change and unexpected weight change.  HENT: Negative for mouth sores and voice change.   Eyes: Negative for pain and redness.  Respiratory: Negative for cough and shortness of breath.   Cardiovascular: Negative for chest pain and palpitations.  Genitourinary: Negative  for dysuria and hematuria.  Musculoskeletal: Negative for arthralgias and myalgias.  Skin: Negative for pallor and rash.  Neurological: Negative for weakness and headaches.  Hematological: Negative for adenopathy.     Past Medical History: Past Medical History:  Diagnosis Date  . Acoustic neuroma ([place])   . Anemia   . Nephrolithiasis   . Papanicolaou smear of cervix with low risk human papillomavirus (HPV) DNA test positive 12/01/2017   Repeat Pap June 2020     Past Surgical History: Past Surgical History:  Procedure Laterality Date  . ACOUSTIC NEUROMA RESECTION  12/14/2012   neuroma removal  . CHOLECYSTECTOMY  2010  . ESSURE TUBAL LIGATION    . EYE SURGERY     x3  . TUBAL LIGATION     Probable ERCP 2 weeks after cholecystectomy done while living in the [place].  See above  Family History: Family History  Problem Relation Age of Onset  . Uterine cancer Sister   . Liver cancer Father   . Cancer Father   . Transient ischemic attack Unknown        grandparents  . Hypothyroidism Maternal Grandmother   . Transient ischemic attack Maternal Grandfather   . Transient ischemic attack Paternal Grandfather     Social History: Social History   Socioeconomic History  . Marital status: Legally Separated    Spouse name: Sarah Simmons  . Number of children: 3  . Years of education: Assoc  . Highest education level: Not on file  Occupational History  . Occupation: Programmer, multimedia: [employer]  . Financial resource strain: Not on  file  . Food insecurity:    Worry: Not on file    Inability: Not on file  . Transportation needs:    Medical: Not on file    Non-medical: Not on file  Tobacco Use  . Smoking status: Never Smoker  . Smokeless tobacco: Never Used  Substance and Sexual Activity  . Alcohol use: Yes    Alcohol/week: 0.0 oz    Comment: rarely  . Drug use: No  . Sexual activity: Yes    Partners: Male    Birth control/protection: Surgical    Comment:  Essure  Lifestyle  . Physical activity:    Days per week: Not on file    Minutes per session: Not on file  . Stress: Not on file  Relationships  . Social connections:    Talks on phone: Not on file    Gets together: Not on file    Attends religious service: Not on file    Active member of club or organization: Not on file    Attends meetings of clubs or organizations: Not on file    Relationship status: Not on file  Other Topics Concern  . Not on file  Social History Narrative   Patient lives at home with family.   Originally from [place], [place].      [place] 2015.      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    Allergies: Allergies  Allergen Reactions  . Chocolate     Outpatient Meds: Current Outpatient Medications  Medication Sig Dispense Refill  . Cholecalciferol (VITAMIN D) 2000 units CAPS Take 1 capsule by mouth daily.    Marland Kitchen esomeprazole (NEXIUM) 40 MG capsule Take 1 capsule (40 mg total) by mouth daily at 12 noon. 30 capsule 0  . Multiple Vitamin (MULTIVITAMIN) tablet Take 1 tablet by mouth daily.     No current facility-administered medications for this visit.       ___________________________________________________________________ Objective   Exam:  BP 108/64   Pulse 62   Ht 5\' 9"  (1.753 m)   Wt 216 lb 6.4 oz (98.2 kg)   LMP 12/20/2017 (Exact Date)   BMI 31.96 kg/m    General: this is a(n) well-appearing woman, no acute distress  Eyes: sclera anicteric, no redness  ENT: oral mucosa moist without lesions, no cervical or supraclavicular lymphadenopathy, good dentition  CV: RRR without murmur, S1/S2, no JVD, no peripheral edema  Resp: clear to auscultation bilaterally, normal RR and effort noted  GI: soft, moderate  focal midline epigastric tenderness, with active bowel sounds. No guarding or palpable organomegaly noted.  Skin; warm and dry, no rash or jaundice noted  Neuro: awake, alert and oriented x 3.  Normal Simmons motor function and fluent speech  Labs:  CBC Latest Ref Rng & Units 12/24/2017 12/23/2017 11/29/2017  WBC 3.8 - 10.8 Thousand/uL 6.2 CANCELED 6.5  Hemoglobin 11.7 - 15.5 g/dL 13.5 - 13.7  Hematocrit 35.0 - 45.0 % 41.7 - 42.6  Platelets 140 - 400 Thousand/uL 390 - 342   CMP Latest Ref Rng & Units 12/24/2017 12/23/2017 11/29/2017  Glucose 65 - 99 mg/dL 98 CANCELED 90  BUN 7 - 25 mg/dL 13 - 15  Creatinine 0.50 - 1.10 mg/dL 0.83 - 0.89  Sodium 135 - 146 mmol/L 138 - 139  Potassium 3.5 - 5.3 mmol/L 4.1 - 4.2  Chloride 98 - 110 mmol/L 105 - 105  CO2 20 - 32 mmol/L 27 -  27  Calcium 8.6 - 10.2 mg/dL 9.1 - 9.2  Total Protein 6.1 - 8.1 g/dL 6.6 - 6.6  Total Bilirubin 0.2 - 1.2 mg/dL 0.5 - 0.6  Alkaline Phos 33 - 115 U/L - - -  AST 10 - 30 U/L 12 - 15  ALT 6 - 29 U/L 12 - 20     Radiologic Studies:  12/24/17 CTAP with contrast - negative for acute pathology S/p chole, no biliary ductal dilatation 61mm liver lesion, too small to characterize Fat-containing umbilical hernia  Assessment: Encounter Diagnoses  Name Primary?  . Abdominal pain, epigastric Yes  . Early satiety   . Nausea without vomiting   . Liver lesion, right lobe     Possible peptic ulcer, but unclear what would have triggered that lately since she has not been taking aspirin or NSAIDs.  H. pylori status is unknown.  Choledocholithiasis possible but seems less likely would be focal tenderness, normal caliber CBD on CT scan and normal LFTs.  Nevertheless, she has a history of this and we must consider it.  Plan:  Upper endoscopy by next week.  She is agreeable after discussion of procedure and risks.  The benefits and risks of the planned procedure were described in detail with the patient or (when appropriate) their health care proxy.  Risks were outlined as including, but not limited to, bleeding, infection, perforation, adverse medication reaction leading to cardiac or pulmonary decompensation, or pancreatitis  (if ERCP).  The limitation of incomplete mucosal visualization was also discussed.  No guarantees or warranties were given.  If unremarkable, MRCP to follow.  Thank you for the courtesy of this consult.  Please call me with any questions or concerns.  Nelida Meuse III  CC: Sarah Simmons, [place]

## 2017-12-29 NOTE — Patient Instructions (Signed)
If you are age 37 or older, your body mass index should be between 23-30. Your Body mass index is 31.96 kg/m. If this is out of the aforementioned range listed, please consider follow up with your Primary Care Provider.  If you are age 84 or younger, your body mass index should be between 19-25. Your Body mass index is 31.96 kg/m. If this is out of the aformentioned range listed, please consider follow up with your Primary Care Provider.   You have been scheduled for an endoscopy. Please follow written instructions given to you at your visit today. If you use inhalers (even only as needed), please bring them with you on the day of your procedure. Your physician has requested that you go to www.startemmi.com and enter the access code given to you at your visit today. This web site gives a general overview about your procedure. However, you should still follow specific instructions given to you by our office regarding your preparation for the procedure.  It was a pleasure to meet you today!  Dr. Loletha Carrow

## 2017-12-30 ENCOUNTER — Telehealth: Payer: Self-pay | Admitting: Gastroenterology

## 2017-12-30 ENCOUNTER — Ambulatory Visit (AMBULATORY_SURGERY_CENTER): Payer: 59 | Admitting: Gastroenterology

## 2017-12-30 ENCOUNTER — Encounter: Payer: Self-pay | Admitting: Gastroenterology

## 2017-12-30 VITALS — BP 110/64 | HR 83 | Temp 98.3°F | Resp 16 | Ht 69.0 in | Wt 216.0 lb

## 2017-12-30 DIAGNOSIS — R1013 Epigastric pain: Secondary | ICD-10-CM

## 2017-12-30 MED ORDER — SODIUM CHLORIDE 0.9 % IV SOLN: 500.0000 mL | Freq: Once | INTRAVENOUS | Status: DC

## 2017-12-30 NOTE — Patient Instructions (Signed)
Impression/Recommendations:  Resume previous diet. Continue present medications.  Discontinue PPI's today.  Perform MRCP at appointment to be scheduled.  YOU HAD AN ENDOSCOPIC PROCEDURE TODAY AT [facility]:   Refer to the procedure report that was given to you for any specific questions about what was found during the examination.  If the procedure report does not answer your questions, please call your gastroenterologist to clarify.  If you requested that your care partner not be given the details of your procedure findings, then the procedure report has been included in a sealed envelope for you to review at your convenience later.  YOU SHOULD EXPECT: Some feelings of bloating in the abdomen. Passage of more gas than usual.  Walking can help get rid of the air that was put into your GI tract during the procedure and reduce the bloating. If you had a lower endoscopy (such as a colonoscopy or flexible sigmoidoscopy) you may notice spotting of blood in your stool or on the toilet paper. If you underwent a bowel prep for your procedure, you may not have a normal bowel movement for a few days.  Please Note:  You might notice some irritation and congestion in your nose or some drainage.  This is from the oxygen used during your procedure.  There is no need for concern and it should clear up in a day or so.  SYMPTOMS TO REPORT IMMEDIATELY:  Following upper endoscopy (EGD)  Vomiting of blood or coffee ground material  New chest pain or pain under the shoulder blades  Painful or persistently difficult swallowing  New shortness of breath  Fever of 100F or higher  Black, tarry-looking stools  For urgent or emergent issues, a gastroenterologist can be reached at any hour by calling [phone].   DIET:  We do recommend a small meal at first, but then you may proceed to your regular diet.  Drink plenty of fluids but you should avoid alcoholic beverages for 24  hours.  ACTIVITY:  You should plan to take it easy for the rest of today and you should NOT DRIVE or use heavy machinery until tomorrow (because of the sedation medicines used during the test).    FOLLOW UP: Our staff will call the number listed on your records the next business day following your procedure to check on you and address any questions or concerns that you may have regarding the information given to you following your procedure. If we do not reach you, we will leave a message.  However, if you are feeling well and you are not experiencing any problems, there is no need to return our call.  We will assume that you have returned to your regular daily activities without incident.  If any biopsies were taken you will be contacted by phone or by letter within the next 1-3 weeks.  Please call us at [phone] if you have not heard about the biopsies in 3 weeks.    SIGNATURES/CONFIDENTIALITY: You and/or your care partner have signed paperwork which will be entered into your electronic medical record.  These signatures attest to the fact that that the information above on your After Visit Summary has been reviewed and is understood.  Full responsibility of the confidentiality of this discharge information lies with you and/or your care-partner.

## 2017-12-30 NOTE — Progress Notes (Signed)
To PACU, VSS. Report to [email or web address] 

## 2017-12-30 NOTE — Telephone Encounter (Signed)
Please arrange MRCP for this patient. Dx: epigastric pain, normal EGD, history of choledocholithiasis

## 2017-12-30 NOTE — Op Note (Signed)
[facility] Patient Name: Sarah Simmons Procedure Date: 12/30/2017 11:09 AM MRN: [id number] Endoscopist: [place]. Loletha Carrow , MD Age: 37 Referring MD:  Date of Birth: 05-26-1981 Gender: Female Account #: [id number] Procedure:                Upper GI endoscopy Indications:              Epigastric abdominal pain (3 weeks) radiating to                            back. prior cholecystectomy ( and probable ERCP for                            reported biliary stones) - normal CBD caliber on CT                            scan and normal LFTs Medicines:                Monitored Anesthesia Care Procedure:                Pre-Anesthesia Assessment:                           - Prior to the procedure, a History and Physical                            was performed, and patient medications and                            allergies were reviewed. The patient's tolerance of                            previous anesthesia was also reviewed. The risks                            and benefits of the procedure and the sedation                            options and risks were discussed with the patient.                            All questions were answered, and informed consent                            was obtained. Prior Anticoagulants: The patient has                            taken no previous anticoagulant or antiplatelet                            agents. ASA Grade Assessment: II - A patient with                            mild systemic disease. After reviewing the risks  and benefits, the patient was deemed in                            satisfactory condition to undergo the procedure.                           After obtaining informed consent, the endoscope was                            passed under direct vision. Throughout the                            procedure, the patient's blood pressure, pulse, and                            oxygen saturations were  monitored continuously. The                            Endoscope was introduced through the mouth, and                            advanced to the second part of duodenum. The upper                            GI endoscopy was accomplished without difficulty.                            The patient tolerated the procedure well. Scope In: Scope Out: Findings:                 The esophagus was normal.                           The stomach was normal.                           The cardia and gastric fundus were normal on                            retroflexion.                           The examined duodenum was normal. Complications:            No immediate complications. Estimated Blood Loss:     Estimated blood loss: none. Impression:               - Normal esophagus.                           - Normal stomach.                           - Normal examined duodenum.                           - No specimens collected. Recommendation:           -  Patient has a contact number available for                            emergencies. The signs and symptoms of potential                            delayed complications were discussed with the                            patient. Return to normal activities tomorrow.                            Written discharge instructions were provided to the                            patient.                           - Resume previous diet.                           - Continue present medications.                           - Discontinue PPIs today.                           - Perform MRCP at appointment to be scheduled. Sarah Simmons L. Loletha Carrow, MD 12/30/2017 11:24:01 AM This report has been signed electronically.

## 2017-12-31 ENCOUNTER — Other Ambulatory Visit: Payer: Self-pay

## 2017-12-31 DIAGNOSIS — R1013 Epigastric pain: Secondary | ICD-10-CM

## 2017-12-31 NOTE — Telephone Encounter (Signed)
Patient aware that [facility] will contact her to schedule.

## 2017-12-31 NOTE — Telephone Encounter (Signed)
Order placed for MRCP at [facility], verified with the scheduler and they will contact the patient to schedule.

## 2018-01-03 NOTE — Telephone Encounter (Signed)
Patient says that she has not been contacted as of yet to schedule.

## 2018-01-03 NOTE — Telephone Encounter (Signed)
Called patient back and gave her their phone number, [phone], to contact them.

## 2018-01-04 ENCOUNTER — Telehealth: Payer: Self-pay | Admitting: *Deleted

## 2018-01-04 DIAGNOSIS — H9191 Unspecified hearing loss, right ear: Secondary | ICD-10-CM

## 2018-01-04 NOTE — Telephone Encounter (Signed)
  Follow up Call-  Call back number 12/30/2017  Post procedure Call Back phone  # [phone]  Permission to leave phone message Yes  Some recent data might be hidden     Patient questions:  Do you have a fever, pain , or abdominal swelling? No. Pain Score  3 *  Have you tolerated food without any problems? Yes.    Have you been able to return to your normal activities? Yes.    Do you have any questions about your discharge instructions: Diet   No. Medications  No. Follow up visit  No.  Do you have questions or concerns about your Care? Yes.    Actions: * If pain score is 4 or above: No action needed, pain <4.

## 2018-01-05 ENCOUNTER — Encounter: Payer: 59 | Admitting: Gastroenterology

## 2018-01-08 ENCOUNTER — Ambulatory Visit
Admission: RE | Admit: 2018-01-08 | Discharge: 2018-01-08 | Disposition: A | Discharge status: Home / Self Care | Payer: 59 | Source: Ambulatory Visit | Attending: Gastroenterology | Admitting: Gastroenterology

## 2018-01-08 DIAGNOSIS — R1013 Epigastric pain: Secondary | ICD-10-CM

## 2018-01-08 MED ORDER — GADOBENATE DIMEGLUMINE 529 MG/ML IV SOLN
19.0000 mL | Freq: Once | INTRAVENOUS | Status: AC | PRN
  Administered 2018-01-08: 19 mL via INTRAVENOUS

## 2018-01-10 ENCOUNTER — Telehealth: Payer: Self-pay | Admitting: Gastroenterology

## 2018-01-11 NOTE — Telephone Encounter (Signed)
Patient calling back regarding this. Best # [phone]

## 2018-06-05 DIAGNOSIS — J209 Acute bronchitis, unspecified: Secondary | ICD-10-CM | POA: Diagnosis not present

## 2018-08-08 ENCOUNTER — Other Ambulatory Visit: Payer: Self-pay

## 2018-08-08 ENCOUNTER — Emergency Department (INDEPENDENT_AMBULATORY_CARE_PROVIDER_SITE_OTHER)
Admission: EM | Admit: 2018-08-08 | Discharge: 2018-08-08 | Disposition: A | Discharge status: Home / Self Care | Payer: BLUE CROSS/BLUE SHIELD | Source: Home / Self Care

## 2018-08-08 DIAGNOSIS — W57XXXA Bitten or stung by nonvenomous insect and other nonvenomous arthropods, initial encounter: Secondary | ICD-10-CM | POA: Diagnosis not present

## 2018-08-08 DIAGNOSIS — S80862A Insect bite (nonvenomous), left lower leg, initial encounter: Secondary | ICD-10-CM

## 2018-08-08 MED ORDER — DOXYCYCLINE HYCLATE 100 MG PO CAPS: 100.0000 mg | ORAL_CAPSULE | Freq: Two times a day (BID) | ORAL | 0 refills | Status: DC

## 2018-08-08 NOTE — Discharge Instructions (Signed)
°  Please take antibiotics as prescribed and be sure to complete entire course even if you start to feel better to ensure infection does not come back.  Please keep bites clean with warm water and mild soap.  You may use over the counter cortisone cream to help with itching. Please follow up in 4-5 days if not improving, sooner if significantly worsening.

## 2018-08-08 NOTE — ED Provider Notes (Signed)
[facility]    CSN: [id number] Arrival date & time: 08/08/18  1541     History   Chief Complaint Chief Complaint  Patient presents with  . Insect Bite    HPI Sarah Simmons is a 38 y.o. female.   HPI  Sarah Simmons is a 38 y.o. female presenting to UC with c/o 4 red spots on her Left lower leg that she noticed after driving her car 5 days ago.  The areas are hot and swollen, gradually improved since onset but the areas are still itchy, burning and sore at times. She has tried benadryl with mild relief. Pt also c/o fatigue, feeling "worn out." Pt is concerned they are spider bites. She is leaving to be out of town for a week and wants to make sure it is safe for her to go, that these spots don't get worse. Denies fever, chills, n/v/d. No new soaps lotions or medications. No joint pain or swelling.   Past Medical History:  Diagnosis Date  . Acoustic neuroma ([place])   . Anemia   . Nephrolithiasis   . Papanicolaou smear of cervix with low risk human papillomavirus (HPV) DNA test positive 12/01/2017   Repeat Pap June 2020    Patient Active Problem List   Diagnosis Date Noted  . Acute periumbilical pain 78/29/5621  . Papanicolaou smear of cervix with low risk human papillomavirus (HPV) DNA test positive 12/01/2017  . Class 1 obesity due to excess calories without serious comorbidity in adult 11/29/2017  . Umbilical hernia without obstruction and without gangrene 11/29/2017  . Vitamin D deficiency 11/29/2017  . Encounter for post Essure sterilization check 11/29/2017  . Tenderness of right axilla 11/20/2016  . Axillary tenderness, left 11/20/2016  . Fatigue 11/20/2016  . Unintentional weight loss 11/20/2016  . Deaf, right 01/14/2016  . Right-sided vestibular weakness 01/14/2016  . 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 Chest wall pain 12/04/2014  . 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 Hair loss 03/20/2014  . Right facial  numbness 03/20/2014  . Right-sided sensorineural hearing loss 03/20/2014    Past Surgical History:  Procedure Laterality Date  . ACOUSTIC NEUROMA RESECTION  12/14/2012   neuroma removal  . CHOLECYSTECTOMY  2010  . ESSURE TUBAL LIGATION    . EYE SURGERY     x3  . TUBAL LIGATION      OB History    Gravida  3   Para  3   Term      Preterm      AB      Living        SAB      TAB      Ectopic      Multiple      Live Births               Home Medications    Prior to Admission medications   Medication Sig Start Date End Date Taking? Authorizing Provider  Cholecalciferol (VITAMIN D) 2000 units CAPS Take 1 capsule by mouth daily.    [provider]  doxycycline (VIBRAMYCIN) 100 MG capsule Take 1 capsule (100 mg total) by mouth 2 (two) times daily. One po bid x 7 days 08/08/18   Noe Gens, PA-C  esomeprazole (NEXIUM) 40 MG capsule Take 1 capsule (40 mg total) by mouth daily at 12 noon. 12/23/17   Trixie Dredge, PA-C  Multiple Vitamin (MULTIVITAMIN) tablet Take 1 tablet by mouth daily.  [provider]    Family History Family History  Problem Relation Age of Onset  . Uterine cancer Sister   . Liver cancer Father   . Cancer Father   . Transient ischemic attack Other        grandparents  . Hypothyroidism Maternal Grandmother   . Transient ischemic attack Maternal Grandfather   . Transient ischemic attack Paternal Grandfather     Social History Social History   Tobacco Use  . Smoking status: Never Smoker  . Smokeless tobacco: Never Used  Substance Use Topics  . Alcohol use: Yes    Alcohol/week: 0.0 standard drinks    Comment: rarely  . Drug use: No     Allergies   Chocolate and Other   Review of Systems Review of Systems  Constitutional: Positive for fatigue. Negative for fever.  Musculoskeletal: Positive for myalgias. Negative for arthralgias and joint swelling.  Skin: Positive for color change. Negative  for wound.     Physical Exam Triage Vital Signs ED Triage Vitals  Enc Vitals Group     BP 08/08/18 1631 113/78     Pulse Rate 08/08/18 1631 88     Resp 08/08/18 1631 20     Temp 08/08/18 1631 98.2 F (36.8 C)     Temp Source 08/08/18 1631 Oral     SpO2 08/08/18 1631 99 %     Weight 08/08/18 1633 209 lb (94.8 kg)     Height 08/08/18 1633 5' 8.5" (1.74 m)     Head Circumference --      Peak Flow --      Pain Score 08/08/18 1632 0     Pain Loc --      Pain Edu? --      Excl. in Industry? --    No data found.  Updated Vital Signs BP 113/78 (BP Location: Right Arm)   Pulse 88   Temp 98.2 F (36.8 C) (Oral)   Resp 20   Ht 5' 8.5" (1.74 m)   Wt 209 lb (94.8 kg)   LMP 07/26/2018   SpO2 99%   BMI 31.32 kg/m   Visual Acuity Right Eye Distance:   Left Eye Distance:   Bilateral Distance:    Right Eye Near:   Left Eye Near:    Bilateral Near:     Physical Exam Vitals signs and nursing note reviewed.  Constitutional:      Appearance: Normal appearance. She is well-developed.  HENT:     Head: Normocephalic and atraumatic.  Neck:     Musculoskeletal: Normal range of motion.  Cardiovascular:     Rate and Rhythm: Normal rate.  Pulmonary:     Effort: Pulmonary effort is normal. No respiratory distress.  Musculoskeletal: Normal range of motion.        General: No tenderness.     Comments: Left lower leg: calf is soft, non-tender.  Skin:    General: Skin is warm and dry.     Findings: Erythema present.     Comments: Left lower leg: 4 erythematous 4-5cm macular type lesions. Mildly tender. No induration or fluctuance. Skin in tact.  Neurological:     Mental Status: She is alert and oriented to person, place, and time.  Psychiatric:        Behavior: Behavior normal.      UC Treatments / Results  Labs (all labs ordered are listed, but only abnormal results are displayed) Labs Reviewed - No data to display  EKG None  Radiology No results  found.  Procedures Procedures (including critical care time)  Medications Ordered in UC Medications - No data to display  Initial Impression / Assessment and Plan / UC Course  I have reviewed the triage vital signs and the nursing notes.  Pertinent labs & imaging results that were available during my care of the patient were reviewed by me and considered in my medical decision making (see chart for details).     Hx and exam c/w infected insect bites Will start pt on doxycycline  Final Clinical Impressions(s) / UC Diagnoses   Final diagnoses:  Bug bite with infection, initial encounter     Discharge Instructions      Please take antibiotics as prescribed and be sure to complete entire course even if you start to feel better to ensure infection does not come back.  Please keep bites clean with warm water and mild soap.  You may use over the counter cortisone cream to help with itching. Please follow up in 4-5 days if not improving, sooner if significantly worsening.     ED Prescriptions    Medication Sig Dispense Auth. Provider   doxycycline (VIBRAMYCIN) 100 MG capsule Take 1 capsule (100 mg total) by mouth 2 (two) times daily. One po bid x 7 days 14 capsule Noe Gens, [place]     Controlled Substance Prescriptions [place] Controlled Substance Registry consulted? Not Applicable   Tyrell Antonio 08/09/18 [id number]

## 2018-08-08 NOTE — ED Triage Notes (Signed)
4 spots on left leg noticed on Thursday, hot and swollen, on Friday morning.  Itchy, stung, then hurt on Friday morning.

## 2018-09-12 ENCOUNTER — Encounter (HOSPITAL_COMMUNITY): Payer: Self-pay

## 2018-09-12 ENCOUNTER — Emergency Department (HOSPITAL_COMMUNITY)
Admission: EM | Admit: 2018-09-12 | Discharge: 2018-09-12 | Disposition: A | Discharge status: Home / Self Care | Payer: BLUE CROSS/BLUE SHIELD | Attending: Emergency Medicine | Admitting: Emergency Medicine

## 2018-09-12 ENCOUNTER — Emergency Department (HOSPITAL_COMMUNITY): Payer: BLUE CROSS/BLUE SHIELD

## 2018-09-12 ENCOUNTER — Other Ambulatory Visit: Payer: Self-pay

## 2018-09-12 DIAGNOSIS — T7840XA Allergy, unspecified, initial encounter: Secondary | ICD-10-CM

## 2018-09-12 DIAGNOSIS — R0602 Shortness of breath: Secondary | ICD-10-CM | POA: Diagnosis not present

## 2018-09-12 DIAGNOSIS — Z79899 Other long term (current) drug therapy: Secondary | ICD-10-CM | POA: Insufficient documentation

## 2018-09-12 DIAGNOSIS — R079 Chest pain, unspecified: Secondary | ICD-10-CM | POA: Diagnosis not present

## 2018-09-12 LAB — BASIC METABOLIC PANEL
Anion gap: 11 (ref 5–15)
BUN: 17 mg/dL (ref 6–20)
CO2: 19 mmol/L — ABNORMAL LOW (ref 22–32)
Calcium: 8.7 mg/dL — ABNORMAL LOW (ref 8.9–10.3)
Chloride: 107 mmol/L (ref 98–111)
Creatinine, Ser: 1.03 mg/dL — ABNORMAL HIGH (ref 0.44–1.00)
GFR calc Af Amer: >60 mL/min (ref >60)
GFR calc non Af Amer: >60 mL/min (ref >60)
Glucose, Bld: 106 mg/dL — ABNORMAL HIGH (ref 70–99)
Potassium: 4.4 mmol/L (ref 3.5–5.1)
Sodium: 137 mmol/L (ref 135–145)

## 2018-09-12 LAB — I-STAT BETA HCG BLOOD, ED (MC, WL, AP ONLY): I-stat hCG, quantitative: <5 m[IU]/mL (ref <5)

## 2018-09-12 LAB — CBC
HCT: 47.7 % — ABNORMAL HIGH (ref 36.0–46.0)
Hemoglobin: 15.2 g/dL — ABNORMAL HIGH (ref 12.0–15.0)
MCH: 26.9 pg (ref 26.0–34.0)
MCHC: 31.9 g/dL (ref 30.0–36.0)
MCV: 84.3 fL (ref 80.0–100.0)
Platelets: 347 10*3/uL (ref 150–400)
RBC: 5.66 MIL/uL — ABNORMAL HIGH (ref 3.87–5.11)
RDW: 13.1 % (ref 11.5–15.5)
WBC: 9.2 10*3/uL (ref 4.0–10.5)
nRBC: 0 % (ref 0.0–0.2)

## 2018-09-12 LAB — D-DIMER, QUANTITATIVE: D-Dimer, Quant: 8.25 ug/mL-FEU — ABNORMAL HIGH (ref 0.00–0.50)

## 2018-09-12 LAB — TROPONIN I: Troponin I: <0.03 ng/mL (ref <0.03)

## 2018-09-12 LAB — BRAIN NATRIURETIC PEPTIDE: B Natriuretic Peptide: 13 pg/mL (ref 0.0–100.0)

## 2018-09-12 MED ORDER — FAMOTIDINE 20 MG PO TABS: 20.0000 mg | ORAL_TABLET | Freq: Two times a day (BID) | ORAL | 0 refills | Status: DC

## 2018-09-12 MED ORDER — EPINEPHRINE 0.3 MG/0.3ML IJ SOAJ: 0.3000 mg | INTRAMUSCULAR | 1 refills | Status: DC | PRN

## 2018-09-12 MED ORDER — METHYLPREDNISOLONE SODIUM SUCC 125 MG IJ SOLR
125.0000 mg | Freq: Once | INTRAMUSCULAR | Status: AC
  Administered 2018-09-12: 125 mg via INTRAVENOUS
  Filled 2018-09-12: qty 2

## 2018-09-12 MED ORDER — DIPHENHYDRAMINE HCL 50 MG/ML IJ SOLN
25.0000 mg | Freq: Once | INTRAMUSCULAR | Status: AC
  Administered 2018-09-12: 11:00:00 25 mg via INTRAVENOUS
  Filled 2018-09-12: qty 1

## 2018-09-12 MED ORDER — PREDNISONE 50 MG PO TABS: 50.0000 mg | ORAL_TABLET | Freq: Every day | ORAL | 0 refills | Status: DC

## 2018-09-12 MED ORDER — IOHEXOL 350 MG/ML SOLN
100.0000 mL | Freq: Once | INTRAVENOUS | Status: AC | PRN
  Administered 2018-09-12: 13:00:00 100 mL via INTRAVENOUS

## 2018-09-12 MED ORDER — DIPHENHYDRAMINE HCL 25 MG PO TABS: 25.0000 mg | ORAL_TABLET | Freq: Four times a day (QID) | ORAL | 0 refills | Status: DC

## 2018-09-12 NOTE — ED Notes (Signed)
Patient verbalizes understanding of discharge instructions. Opportunity for questioning and answers were provided. 

## 2018-09-12 NOTE — ED Provider Notes (Signed)
[facility] EMERGENCY DEPARTMENT Provider Note   CSN: [id number] Arrival date & time: 09/12/18  7425    History   Chief Complaint Chief Complaint  Patient presents with  . Near Syncope  . Chest Pain  . Shortness of Breath    HPI Sarah Simmons is a 38 y.o. female.     HPI Patient presents to the emergency room for evaluation of shortness of breath and chest discomfort associated with an allergic reaction.  Patient states last evening she had the development of diffuse hives.  Patient states it was all over her body.  She took some Claritin and had some improvement.  She is not sure what triggered it in particular but she has had troubles like this in the past.  This morning she did not notice the rash but she started to have an episode of difficulty breathing associated with a sharp pain in the center of her chest.  Sharp pain resolved but she does have some pressure discomfort still.  She also feels like she has difficulty in her breath.  She has not been coughing.  She has not been wheezing.  She has not had any fevers.  She denies any swelling.  She does not have any history of heart or lung disease. Past Medical History:  Diagnosis Date  . Acoustic neuroma ([place])   . Anemia   . Nephrolithiasis   . Papanicolaou smear of cervix with low risk human papillomavirus (HPV) DNA test positive 12/01/2017   Repeat Pap June 2020    Patient Active Problem List   Diagnosis Date Noted  . Acute periumbilical pain 95/63/8756  . Papanicolaou smear of cervix with low risk human papillomavirus (HPV) DNA test positive 12/01/2017  . Class 1 obesity due to excess calories without serious comorbidity in adult 11/29/2017  . Umbilical hernia without obstruction and without gangrene 11/29/2017  . Vitamin D deficiency 11/29/2017  . Encounter for post Essure sterilization check 11/29/2017  . Tenderness of right axilla 11/20/2016  . Axillary tenderness, left 11/20/2016  . Fatigue  11/20/2016  . Unintentional weight loss 11/20/2016  . Deaf, right 01/14/2016  . Right-sided vestibular weakness 01/14/2016  . 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 Chest wall pain 12/04/2014  . 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 Hair loss 03/20/2014  . Right facial numbness 03/20/2014  . Right-sided sensorineural hearing loss 03/20/2014    Past Surgical History:  Procedure Laterality Date  . ACOUSTIC NEUROMA RESECTION  12/14/2012   neuroma removal  . CHOLECYSTECTOMY  2010  . ESSURE TUBAL LIGATION    . EYE SURGERY     x3  . TUBAL LIGATION       OB History    Gravida  3   Para  3   Term      Preterm      AB      Living        SAB      TAB      Ectopic      Multiple      Live Births               Home Medications    Prior to Admission medications   Medication Sig Start Date End Date Taking? Authorizing Provider  Cholecalciferol (VITAMIN D) 50 MCG (2000 UT) tablet Take 2,000 Units by mouth daily.    Yes [provider]  loratadine-pseudoephedrine (CLARITIN-D 12-HOUR) 5-120 MG tablet Take 1 tablet by  mouth daily as needed for allergies.   Yes [provider]  Multiple Vitamin (MULTIVITAMIN) tablet Take 1 tablet by mouth daily.   Yes [provider]  diphenhydrAMINE (BENADRYL) 25 MG tablet Take 1 tablet (25 mg total) by mouth every 6 (six) hours. 09/12/18   Dorie Rank, MD  EPINEPHrine (EPIPEN 2-PAK) 0.3 mg/0.3 mL IJ SOAJ injection Inject 0.3 mLs (0.3 mg total) into the muscle as needed for anaphylaxis. 09/12/18   Dorie Rank, MD  esomeprazole (NEXIUM) 40 MG capsule Take 1 capsule (40 mg total) by mouth daily at 12 noon. Patient not taking: Reported on 09/12/2018 12/23/17   Trixie Dredge, PA-C  famotidine (PEPCID) 20 MG tablet Take 1 tablet (20 mg total) by mouth 2 (two) times daily. 09/12/18   Dorie Rank, MD  predniSONE (DELTASONE) 50 MG tablet Take 1 tablet (50 mg total) by mouth daily.  09/12/18   Dorie Rank, MD    Family History Family History  Problem Relation Age of Onset  . Uterine cancer Sister   . Liver cancer Father   . Cancer Father   . Transient ischemic attack Other        grandparents  . Hypothyroidism Maternal Grandmother   . Transient ischemic attack Maternal Grandfather   . Transient ischemic attack Paternal Grandfather     Social History Social History   Tobacco Use  . Smoking status: Never Smoker  . Smokeless tobacco: Never Used  Substance Use Topics  . Alcohol use: Yes    Alcohol/week: 0.0 standard drinks    Comment: rarely  . Drug use: No     Allergies   Chocolate and Other   Review of Systems Review of Systems  All other systems reviewed and are negative.    Physical Exam Updated Vital Signs BP 111/69 (BP Location: Right Arm)   Pulse 100   Temp 98.7 F (37.1 C) (Oral)   Resp 18   Ht 1.74 m (5' 8.5")   Wt 94.8 kg   LMP 08/22/2018   SpO2 98%   BMI 31.32 kg/m   Physical Exam Vitals signs and nursing note reviewed.  Constitutional:      General: She is not in acute distress.    Appearance: She is well-developed.  HENT:     Head: Normocephalic and atraumatic.     Right Ear: External ear normal.     Left Ear: External ear normal.  Eyes:     General: No scleral icterus.       Right eye: No discharge.        Left eye: No discharge.     Conjunctiva/sclera: Conjunctivae normal.  Neck:     Musculoskeletal: Neck supple.     Trachea: No tracheal deviation.  Cardiovascular:     Rate and Rhythm: Normal rate and regular rhythm.  Pulmonary:     Effort: Pulmonary effort is normal. No respiratory distress.     Breath sounds: Normal breath sounds. No stridor. No wheezing or rales.  Abdominal:     General: Bowel sounds are normal. There is no distension.     Palpations: Abdomen is soft.     Tenderness: There is no abdominal tenderness. There is no guarding or rebound.  Musculoskeletal:        General: No tenderness.   Skin:    General: Skin is warm and dry.     Findings: Rash present.     Comments: Erythematous macular papular , ?urticarial rash on extremities, no large confluent urticarial lesions  Neurological:     Mental Status: She is alert.     Cranial Nerves: No cranial nerve deficit (no facial droop, extraocular movements intact, no slurred speech).     Sensory: No sensory deficit.     Motor: No abnormal muscle tone or seizure activity.     Coordination: Coordination normal.      ED Treatments / Results  Labs (all labs ordered are listed, but only abnormal results are displayed) Labs Reviewed  CBC - Abnormal; Notable for the following components:      Result Value   RBC 5.66 (*)    Hemoglobin 15.2 (*)    HCT 47.7 (*)    All other components within normal limits  BASIC METABOLIC PANEL - Abnormal; Notable for the following components:   CO2 19 (*)    Glucose, Bld 106 (*)    Creatinine, Ser 1.03 (*)    Calcium 8.7 (*)    All other components within normal limits  D-DIMER, QUANTITATIVE (NOT AT [facility]) - Abnormal; Notable for the following components:   D-Dimer, Quant 8.25 (*)    All other components within normal limits  TROPONIN I  BRAIN NATRIURETIC PEPTIDE  I-STAT BETA HCG BLOOD, ED ([facility], [facility], [facility] ONLY)    EKG None  Radiology Ct Angio Chest Pe W And/or Wo Contrast  Result Date: 09/12/2018 CLINICAL DATA:  Onset chest pain and shortness of breath this morning. EXAM: CT ANGIOGRAPHY CHEST WITH CONTRAST TECHNIQUE: Multidetector CT imaging of the chest was performed using the standard protocol during bolus administration of intravenous contrast. Multiplanar CT image reconstructions and MIPs were obtained to evaluate the vascular anatomy. CONTRAST:  100 mL OMNIPAQUE IOHEXOL 350 MG/ML SOLN COMPARISON:  Single-view of the chest earlier today. PA and lateral chest 11/24/2016. FINDINGS: Cardiovascular: Satisfactory opacification of the pulmonary arteries to the segmental level. No evidence of  pulmonary embolism. Normal heart size. No pericardial effusion. Mediastinum/Nodes: No enlarged mediastinal, hilar, or axillary lymph nodes. Thyroid gland, trachea, and esophagus demonstrate no significant findings. Calcified mediastinal and right hilar lymph nodes consistent with old granulomatous disease noted. Lungs/Pleura: Lungs are clear. No pleural effusion or pneumothorax. Upper Abdomen: Negative. Musculoskeletal: Negative. Review of the MIP images confirms the above findings. IMPRESSION: Negative for pulmonary embolus.  No acute disease. Old granulomatous disease. Electronically Signed   By: Inge Rise M.D.   On: 09/12/2018 13:23   Dg Chest Portable 1 View  Result Date: 09/12/2018 CLINICAL DATA:  Shortness of breath. Near syncope. Intermittent chest pain. EXAM: PORTABLE CHEST 1 VIEW COMPARISON:  11/24/2016 FINDINGS: The heart size and mediastinal contours are within normal limits. Both lungs are clear. The visualized skeletal structures are unremarkable. IMPRESSION: Normal exam. Electronically Signed   By: Lorriane Shire M.D.   On: 09/12/2018 10:55    Procedures Procedures (including critical care time)  Medications Ordered in ED Medications  diphenhydrAMINE (BENADRYL) injection 25 mg (25 mg Intravenous Given 09/12/18 1051)  methylPREDNISolone sodium succinate (SOLU-MEDROL) 125 mg/2 mL injection 125 mg (125 mg Intravenous Given 09/12/18 1050)  iohexol (OMNIPAQUE) 350 MG/ML injection 100 mL (100 mLs Intravenous Contrast Given 09/12/18 1300)     Initial Impression / Assessment and Plan / ED Course  I have reviewed the triage vital signs and the nursing notes.  Pertinent labs & imaging results that were available during my care of the patient were reviewed by me and considered in my medical decision making (see chart for details).      Patient's initial presentation was suggestive of  possible allergic type reaction.  No clear anaphylaxis as she was not wheezing and seemed to be breathing  easily.  Somewhat atypical however she did have some chest discomfort and tachycardia.  Cardiac enzymes are normal but she did have a significantly elevated d-dimer.  CT angiogram was performed and fortunately no evidence of pulmonary embolism or other acute abnormality.  Unclear what has caused this elevated d-dimer but at this time she appears stable and plan on discharge home with prescription for epinephrine, prednisone, diphenhydramine and Pepcid.  Discussed outpatient follow-up with her allergist or primary care doctor and we also discussed the use of the EpiPen.  Final Clinical Impressions(s) / ED Diagnoses   Final diagnoses:  Allergic reaction, initial encounter    ED Discharge Orders         Ordered    diphenhydrAMINE (BENADRYL) 25 MG tablet  Every 6 hours     09/12/18 1441    predniSONE (DELTASONE) 50 MG tablet  Daily     09/12/18 1441    famotidine (PEPCID) 20 MG tablet  2 times daily     09/12/18 1441    EPINEPHrine (EPIPEN 2-PAK) 0.3 mg/0.3 mL IJ SOAJ injection  As needed     09/12/18 1442           Dorie Rank, MD 09/12/18 1445

## 2018-09-12 NOTE — ED Triage Notes (Signed)
Pt arrives ambulatory from home with C/O episode of near syncope, SHOB and intermittent chest pain this am.  Pt statEs she had allergi reaction last night after exposure to laundry detergent in her bath tub. Pt state reaction resolved last night with no new meds but routinely takes claritin. Pt states itching returning.

## 2018-09-12 NOTE — Discharge Instructions (Addendum)
Take the medications as prescribed, follow-up with your primary care doctor or consider seeing an allergist, make sure to carry the EpiPen with you to be used in a severe reaction as we discussed.  Return as needed for worsening symptoms.

## 2018-09-14 ENCOUNTER — Encounter: Payer: Self-pay | Admitting: Physician Assistant

## 2018-09-14 ENCOUNTER — Other Ambulatory Visit: Payer: Self-pay

## 2018-09-14 ENCOUNTER — Ambulatory Visit (INDEPENDENT_AMBULATORY_CARE_PROVIDER_SITE_OTHER): Payer: BLUE CROSS/BLUE SHIELD | Admitting: Physician Assistant

## 2018-09-14 VITALS — HR 101 | Temp 97.8°F

## 2018-09-14 DIAGNOSIS — T7840XD Allergy, unspecified, subsequent encounter: Secondary | ICD-10-CM | POA: Diagnosis not present

## 2018-09-14 DIAGNOSIS — N179 Acute kidney failure, unspecified: Secondary | ICD-10-CM | POA: Diagnosis not present

## 2018-09-14 DIAGNOSIS — L508 Other urticaria: Secondary | ICD-10-CM

## 2018-09-14 DIAGNOSIS — E559 Vitamin D deficiency, unspecified: Secondary | ICD-10-CM

## 2018-09-14 DIAGNOSIS — R7989 Other specified abnormal findings of blood chemistry: Secondary | ICD-10-CM | POA: Diagnosis not present

## 2018-09-14 MED ORDER — FAMOTIDINE 20 MG PO TABS: 20.0000 mg | ORAL_TABLET | Freq: Two times a day (BID) | ORAL | 0 refills | Status: DC

## 2018-09-14 MED ORDER — CETIRIZINE HCL 10 MG PO TABS: 10.0000 mg | ORAL_TABLET | Freq: Two times a day (BID) | ORAL | 0 refills | Status: DC

## 2018-09-14 MED ORDER — MONTELUKAST SODIUM 10 MG PO TABS: 10.0000 mg | ORAL_TABLET | Freq: Every day | ORAL | 0 refills | Status: DC

## 2018-09-14 MED ORDER — TRIAMCINOLONE ACETONIDE 0.5 % EX OINT: 1.0000 "application " | TOPICAL_OINTMENT | Freq: Two times a day (BID) | CUTANEOUS | 3 refills | Status: AC | PRN

## 2018-09-14 NOTE — Progress Notes (Signed)
Virtual Visit via Telephone Note  I connected with Sarah Simmons on 09/14/18 at  3:40 PM EDT by telephone and verified that I am speaking with the correct person using two identifiers.   I discussed the limitations, risks, security and privacy concerns of performing an evaluation and management service by telephone and the availability of in person appointments. I also discussed with the patient that there may be a patient responsible charge related to this service. The patient expressed understanding and agreed to proceed.   History of Present Illness: She reports she developed hives on Saturday night. She states she had spot washed her comforter earlier that day and thought this was the cause. The following day the hives had spread diffusely over her entire body. She reports when she got out of bed on Monday morning she felt very short of breath, felt faint and developed chest pain.  She has self-reported food allergies to almonds and chocolate. Both cause itching and hives, but she states she is able to eat large quantities of chocolate before developing symptoms.  Treated for acute allergic reaction/urticaria in the ED on 09/12/2018. She was found to have an elevated d-dimer (>8) but thankfully CTA was negative for acute PE. She was discharged home with Prednisone burst 50 mg x 5 days, Pepcid  Reports recurrent itching and hives for the last 2 days. Hives are located on arms, hands, abdomen. Reports rash is improved greatly compared to 2 days ago and improves with Benadryl. She is taking Benadryl every 6 hours and Pepcid 20 mg twice a day. She is concerned that she will have a withdrawal from the Prednisone. She states in the past she has felt very fatigued when she has stopped taking it.  Denies lip/tongue/facial swelling, difficulty swallowing, dyspnea or wheezing.   Past Medical History:  Diagnosis Date  . Acoustic neuroma ([place])   . Anemia   . Nephrolithiasis   . Papanicolaou smear  of cervix with low risk human papillomavirus (HPV) DNA test positive 12/01/2017   Repeat Pap June 2020   Past Surgical History:  Procedure Laterality Date  . ACOUSTIC NEUROMA RESECTION  12/14/2012   neuroma removal  . CHOLECYSTECTOMY  2010  . ESSURE TUBAL LIGATION    . EYE SURGERY     x3  . TUBAL LIGATION     Social History   Tobacco Use  . Smoking status: Never Smoker  . Smokeless tobacco: Never Used  Substance Use Topics  . Alcohol use: Yes    Alcohol/week: 0.0 standard drinks    Comment: rarely   family history includes Cancer in her father; Hypothyroidism in her maternal grandmother; Liver cancer in her father; Transient ischemic attack in her maternal grandfather, paternal grandfather, and another family member; Uterine cancer in her sister.    ROS: negative except as noted in the HPI  Medications: Current Outpatient Medications  Medication Sig Dispense Refill  . diphenhydrAMINE (BENADRYL) 25 MG tablet Take 1 tablet (25 mg total) by mouth every 6 (six) hours. 20 tablet 0  . EPINEPHrine (EPIPEN 2-PAK) 0.3 mg/0.3 mL IJ SOAJ injection Inject 0.3 mLs (0.3 mg total) into the muscle as needed for anaphylaxis. 1 Device 1  . esomeprazole (NEXIUM) 40 MG capsule Take 1 capsule (40 mg total) by mouth daily at 12 noon. 30 capsule 0  . famotidine (PEPCID) 20 MG tablet Take 1 tablet (20 mg total) by mouth 2 (two) times daily for 14 days. 30 tablet 0  . loratadine-pseudoephedrine (CLARITIN-D 12-HOUR) 5-120  MG tablet Take 1 tablet by mouth daily as needed for allergies.    . Multiple Vitamin (MULTIVITAMIN) tablet Take 1 tablet by mouth daily.    . predniSONE (DELTASONE) 50 MG tablet Take 1 tablet (50 mg total) by mouth daily. 5 tablet 0  . VITAMIN D PO Take 4,000 Units by mouth daily.    . cetirizine (ZYRTEC) 10 MG tablet Take 1 tablet (10 mg total) by mouth 2 (two) times daily for 14 days. 30 tablet 0  . montelukast (SINGULAIR) 10 MG tablet Take 1 tablet (10 mg total) by mouth at bedtime  for 14 days. 30 tablet 0  . triamcinolone ointment (KENALOG) 0.5 % Apply 1 application topically 2 (two) times daily as needed for up to 14 days. To affected area, avoid eyes and face 30 g 3   No current facility-administered medications for this visit.    Allergies  Allergen Reactions  . Almond Oil Itching and Other (See Comments)    Flushing   . Chocolate Hives and Itching    If she eats too much       Objective:  Pulse (!) 101   Temp 97.8 F (36.6 C) (Oral)   LMP 08/22/2018   SpO2 97%  Vital signs reviewed Pulm: Normal work of breathing, normal phonation, speaking in full sentences    Results for orders placed or performed during the hospital encounter of 09/12/18 (from the past 72 hour(s))  CBC     Status: Abnormal   Collection Time: 09/12/18 10:30 AM  Result Value Ref Range   WBC 9.2 4.0 - 10.5 K/uL   RBC 5.66 (H) 3.87 - 5.11 MIL/uL   Hemoglobin 15.2 (H) 12.0 - 15.0 g/dL   HCT 47.7 (H) 36.0 - 46.0 %   MCV 84.3 80.0 - 100.0 fL   MCH 26.9 26.0 - 34.0 pg   MCHC 31.9 30.0 - 36.0 g/dL   RDW 13.1 11.5 - 15.5 %   Platelets 347 150 - 400 K/uL   nRBC 0.0 0.0 - 0.2 %    Comment: Performed at [facility], [address], [place], [place] [address]  Basic metabolic panel     Status: Abnormal   Collection Time: 09/12/18 10:30 AM  Result Value Ref Range   Sodium 137 135 - 145 mmol/L   Potassium 4.4 3.5 - 5.1 mmol/L   Chloride 107 98 - 111 mmol/L   CO2 19 (L) 22 - 32 mmol/L   Glucose, Bld 106 (H) 70 - 99 mg/dL   BUN 17 6 - 20 mg/dL   Creatinine, Ser 1.03 (H) 0.44 - 1.00 mg/dL   Calcium 8.7 (L) 8.9 - 10.3 mg/dL   GFR calc non Af Amer >60 >60 mL/min   GFR calc Af Amer >60 >60 mL/min   Anion gap 11 5 - 15    Comment: Performed at [facility], [place] [address] [place], [place] [address]  D-dimer, quantitative (not at [facility])     Status: Abnormal   Collection Time: 09/12/18 10:30 AM  Result Value Ref Range   D-Dimer, Quant 8.25 (H) 0.00 - 0.50 ug/mL-FEU     Comment: (NOTE) At the manufacturer cut-off of 0.50 ug/mL FEU, this assay has been documented to exclude PE with a sensitivity and negative predictive value of 97 to 99%.  At this time, this assay has not been approved by the FDA to exclude DVT/VTE. Results should be correlated with clinical presentation. Performed at [facility], [address] [place], [place] [address]   Troponin I - ONCE - STAT     Status: None   Collection Time: 09/12/18 10:30 AM  Result Value Ref Range   Troponin I <0.03 <0.03 ng/mL    Comment: Performed at [facility] [address] [place], [place] [address]  Brain natriuretic peptide     Status: None   Collection Time: 09/12/18 10:30 AM  Result Value Ref Range   B Natriuretic Peptide 13.0 0.0 - 100.0 pg/mL    Comment: Performed at [facility] [address] [place], [place] [address]  I-Stat Beta hCG blood, ED ([facility], [facility], [facility] only)     Status: None   Collection Time: 09/12/18 10:49 AM  Result Value Ref Range   I-stat hCG, quantitative <5.0 <5 mIU/mL   Comment 3            Comment:   GEST. AGE      CONC.  (mIU/mL)   <=1 WEEK        5 - 50     2 WEEKS       50 - 500     3 WEEKS       100 - 10,000     4 WEEKS     1,000 - 30,000        FEMALE AND NON-PREGNANT FEMALE:     LESS THAN 5 mIU/mL    Ct Angio Chest Pe W And/or Wo Contrast  Result Date: 09/12/2018 CLINICAL DATA:  Onset chest pain and shortness of breath this morning. EXAM: CT ANGIOGRAPHY CHEST WITH CONTRAST TECHNIQUE: Multidetector CT imaging of the chest was performed using the standard protocol during bolus administration of intravenous contrast. Multiplanar CT image reconstructions and MIPs were obtained to evaluate the vascular anatomy. CONTRAST:  100 mL OMNIPAQUE IOHEXOL 350 MG/ML SOLN COMPARISON:  Single-view of the chest earlier today. PA and lateral chest 11/24/2016. FINDINGS: Cardiovascular: Satisfactory opacification of the pulmonary arteries to the segmental level. No  evidence of pulmonary embolism. Normal heart size. No pericardial effusion. Mediastinum/Nodes: No enlarged mediastinal, hilar, or axillary lymph nodes. Thyroid gland, trachea, and esophagus demonstrate no significant findings. Calcified mediastinal and right hilar lymph nodes consistent with old granulomatous disease noted. Lungs/Pleura: Lungs are clear. No pleural effusion or pneumothorax. Upper Abdomen: Negative. Musculoskeletal: Negative. Review of the MIP images confirms the above findings. IMPRESSION: Negative for pulmonary embolus.  No acute disease. Old granulomatous disease. Electronically Signed   By: Inge Rise M.D.   On: 09/12/2018 13:23   Dg Chest Portable 1 View  Result Date: 09/12/2018 CLINICAL DATA:  Shortness of breath. Near syncope. Intermittent chest pain. EXAM: PORTABLE CHEST 1 VIEW COMPARISON:  11/24/2016 FINDINGS: The heart size and mediastinal contours are within normal limits. Both lungs are clear. The visualized skeletal structures are unremarkable. IMPRESSION: Normal exam. Electronically Signed   By: Lorriane Shire M.D.   On: 09/12/2018 10:55      Assessment and Plan: 38 y.o. female with   .Diagnoses and all orders for this visit:  Urticaria, acute -     montelukast (SINGULAIR) 10 MG tablet; Take 1 tablet (10 mg total) by mouth at bedtime for 14 days. -     cetirizine (ZYRTEC) 10 MG tablet; Take 1 tablet (10 mg total) by mouth 2 (two) times daily for 14 days. -     triamcinolone ointment (KENALOG) 0.5 %; Apply 1 application topically 2 (two) times daily as needed for up to 14  days. To affected area, avoid eyes and face -     Ambulatory referral to Allergy -     famotidine (PEPCID) 20 MG tablet; Take 1 tablet (20 mg total) by mouth 2 (two) times daily for 14 days.  Allergic reaction, subsequent encounter -     montelukast (SINGULAIR) 10 MG tablet; Take 1 tablet (10 mg total) by mouth at bedtime for 14 days. -     cetirizine (ZYRTEC) 10 MG tablet; Take 1 tablet (10  mg total) by mouth 2 (two) times daily for 14 days. -     triamcinolone ointment (KENALOG) 0.5 %; Apply 1 application topically 2 (two) times daily as needed for up to 14 days. To affected area, avoid eyes and face -     Ambulatory referral to Allergy -     famotidine (PEPCID) 20 MG tablet; Take 1 tablet (20 mg total) by mouth 2 (two) times daily for 14 days.  D-dimer, elevated  Acute kidney injury (nontraumatic) (HCC) -     BASIC METABOLIC PANEL WITH GFR -     PTH, Intact and Calcium -     VITAMIN D 25 Hydroxy (Vit-D Deficiency, Fractures)  Hypocalcemia -     PTH, Intact and Calcium -     VITAMIN D 25 Hydroxy (Vit-D Deficiency, Fractures)  Vitamin D deficiency -     VITAMIN D 25 Hydroxy (Vit-D Deficiency, Fractures)   Vitals reviewed. Mild tachycardia of 101 noted. No airway/pulmonary symptoms. Pulse ox 97% on RA at rest.  Personally reviewed lab results from ED on 09/12/18. Noted a mild AKI with Scr of 1.03. Repeat renal function in 1 week Recommended switching from Benadryl to Zyrtec 10 mg twice a day x 7-14 days, cont Pepcid 20 mg bid for the next 7-14 days, start Singulair 10 mg QD and topical triamcinolone prn for itching/rash She is already taking high dose OTC vitamin D 4000 mg  Reassurance that single Prednisone burst for 5 days is unlikely to cause steroid withdrawal Given that her symptoms are clinically improving, risks of additional steroid will likely outweigh any potential benefit at this point Referral placed for allergy testing  Follow Up Instructions:    I discussed the assessment and treatment plan with the patient. The patient was provided an opportunity to ask questions and all were answered. The patient agreed with the plan and demonstrated an understanding of the instructions.   The patient was advised to call back or seek an in-person evaluation if the symptoms worsen or if the condition fails to improve as anticipated.  I provided 11-20 minutes of  non-face-to-face time during this encounter.   Trixie Dredge, [place]

## 2018-09-21 ENCOUNTER — Other Ambulatory Visit: Payer: Self-pay | Admitting: Physician Assistant

## 2018-09-21 DIAGNOSIS — T7840XD Allergy, unspecified, subsequent encounter: Secondary | ICD-10-CM

## 2018-09-21 DIAGNOSIS — L508 Other urticaria: Secondary | ICD-10-CM

## 2018-10-06 ENCOUNTER — Other Ambulatory Visit: Payer: Self-pay | Admitting: Physician Assistant

## 2018-10-06 DIAGNOSIS — L508 Other urticaria: Secondary | ICD-10-CM

## 2018-10-06 DIAGNOSIS — T7840XD Allergy, unspecified, subsequent encounter: Secondary | ICD-10-CM

## 2018-10-08 ENCOUNTER — Other Ambulatory Visit: Payer: Self-pay | Admitting: Physician Assistant

## 2018-10-08 DIAGNOSIS — L508 Other urticaria: Secondary | ICD-10-CM

## 2018-10-08 DIAGNOSIS — T7840XD Allergy, unspecified, subsequent encounter: Secondary | ICD-10-CM

## 2018-10-10 NOTE — Telephone Encounter (Signed)
Is it ok for patient to continue taking the medication?

## 2018-10-10 NOTE — Telephone Encounter (Signed)
Should be taken once daily for chronic use

## 2018-10-11 NOTE — Telephone Encounter (Signed)
Patient advised once daily.

## 2018-11-10 ENCOUNTER — Ambulatory Visit (INDEPENDENT_AMBULATORY_CARE_PROVIDER_SITE_OTHER): Payer: BC Managed Care – PPO | Admitting: Allergy

## 2018-11-10 ENCOUNTER — Encounter: Payer: Self-pay | Admitting: Allergy

## 2018-11-10 ENCOUNTER — Other Ambulatory Visit: Payer: Self-pay

## 2018-11-10 VITALS — BP 116/70 | HR 89 | Temp 98.1°F | Resp 16 | Ht 67.0 in | Wt 208.0 lb

## 2018-11-10 DIAGNOSIS — H109 Unspecified conjunctivitis: Secondary | ICD-10-CM | POA: Diagnosis not present

## 2018-11-10 DIAGNOSIS — L508 Other urticaria: Secondary | ICD-10-CM

## 2018-11-10 DIAGNOSIS — J310 Chronic rhinitis: Secondary | ICD-10-CM

## 2018-11-10 DIAGNOSIS — T7840XD Allergy, unspecified, subsequent encounter: Secondary | ICD-10-CM | POA: Diagnosis not present

## 2018-11-10 MED ORDER — FAMOTIDINE 20 MG PO TABS: 20.0000 mg | ORAL_TABLET | Freq: Two times a day (BID) | ORAL | 5 refills | Status: DC

## 2018-11-10 MED ORDER — OLOPATADINE HCL 0.7 % OP SOLN: 1.0000 [drp] | Freq: Every day | OPHTHALMIC | 5 refills | Status: DC | PRN

## 2018-11-10 NOTE — Progress Notes (Signed)
New Patient Note  RE: Sarah Simmons MRN: [id number] DOB: 1980/06/11 Date of Office Visit: 11/10/2018  Referring provider: Ottis Stain* Primary care provider: Trixie Dredge, PA-C  Chief Complaint: allergic reaction  History of present illness: Sarah Simmons is a 38 y.o. female presenting today for consultation for urticaria and allergic reaction.    She states she spot washed her comforter that is colorful in the same detergent she always uses on a Saturday in April and later that day took a bath in the same tub.  She is concerned may be the dyes in the comforter may have triggered the hives.  She developed hives after the bath and had hives for the next 3 days.  That following Monday she woke up from sleep around 5-6 am due to her feet itching.  She states she got up and went to go feed her cat when her vision went black and she states she "stopped breathing" for a bit.  She reports coming to after brief period.  She then went to the ED.  ED visit from 09/12/18 she was treated with IV Benadryl, Solu-Medrol IV and also had a CTA done that was negative.  On labs she was found to have an elevated d-dimer of 8.25 with normal cardiac enzymes.  She was able to be discharged with prescriptions for Benadryl, prednisone x5 days, Pepcid and an EpiPen.  She states the only times in the past she has had hives is related to chocolate.  She states if she eats too much chocolate that she will develop hives.  The only other food that she has had an issue with his almonds and states that her tongue gets itchy and she develops facial flushing.  She states she used to be able to eat almonds without issue however the symptoms following ingestion of almonds started December to March of this year.  She does not recall the food she had surrounding the allergic reaction.  She does endorse eating red meat products.  For hives her PCP started her on singulair, zyrtec, pepcid and  triamcinolone ointment.  She states she took the Singulair and Pepcid along with the prednisone but has since stopped both of these medications.  She only continues on Zyrtec at this time.  She reports large swelling at bite site if she gets bitten by insect/spider.  She thinks she may have had tick bite many years ago.  She states when weather changes she develops watery eyes, runny nose, congestion, sneezing.  She takes zyrtec daily or if symptoms are bad will take Claritin D.    No history of asthma, eczema.   Review of systems: Review of Systems  Constitutional: Negative for chills, fever and malaise/fatigue.  HENT: Negative for congestion, ear discharge, nosebleeds and sore throat.   Eyes: Negative for pain, discharge and redness.  Respiratory: Negative for cough, shortness of breath and wheezing.   Cardiovascular: Negative for chest pain.  Gastrointestinal: Negative for abdominal pain, constipation, diarrhea, heartburn, nausea and vomiting.  Musculoskeletal: Negative for joint pain.  Skin: Negative for itching and rash.  Neurological: Negative for headaches.    All other systems negative unless noted above in HPI  Past medical history: Past Medical History:  Diagnosis Date  . Acoustic neuroma ([place])   . Anemia   . Angio-edema   . Nephrolithiasis   . Papanicolaou smear of cervix with low risk human papillomavirus (HPV) DNA test positive 12/01/2017   Repeat Pap June 2020  .  Urticaria     Past surgical history: Past Surgical History:  Procedure Laterality Date  . ACOUSTIC NEUROMA RESECTION  12/14/2012   neuroma removal  . CHOLECYSTECTOMY  2010  . ESSURE TUBAL LIGATION    . EYE SURGERY     x3  . TUBAL LIGATION      Family history:  Family History  Problem Relation Age of Onset  . Uterine cancer Sister   . Allergic rhinitis Sister   . Liver cancer Father   . Cancer Father   . Allergic rhinitis Father   . Allergic rhinitis Mother   . Transient ischemic attack  Other        grandparents  . Hypothyroidism Maternal Grandmother   . Transient ischemic attack Maternal Grandfather   . Transient ischemic attack Paternal Grandfather     Social history: She lives in an apartment with carpeting with gas heating and central cooling.  There is a cat in the home.  There is no concern for roaches, water damage or mildew in the home.  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  She denies a smoking history.   Medication List: Allergies as of 11/10/2018      Reactions   Almond Oil Itching, Other (See Comments)   Flushing    Chocolate Hives, Itching   If she eats too much      Medication List       Accurate as of November 10, 2018 11:36 AM. If you have any questions, ask your nurse or doctor.        cetirizine 10 MG tablet Commonly known as:  ZYRTEC Take 1 tablet (10 mg total) by mouth daily.   diphenhydrAMINE 25 MG tablet Commonly known as:  BENADRYL Take 1 tablet (25 mg total) by mouth every 6 (six) hours.   EPINEPHrine 0.3 mg/0.3 mL Soaj injection Commonly known as:  EpiPen 2-Pak Inject 0.3 mLs (0.3 mg total) into the muscle as needed for anaphylaxis.   esomeprazole 40 MG capsule Commonly known as:  NEXIUM Take 1 capsule (40 mg total) by mouth daily at 12 noon.   famotidine 20 MG tablet Commonly known as:  Pepcid Take 1 tablet (20 mg total) by mouth 2 (two) times daily for 14 days.   loratadine-pseudoephedrine 5-120 MG tablet Commonly known as:  CLARITIN-D 12-hour Take 1 tablet by mouth daily as needed for allergies.   montelukast 10 MG tablet Commonly known as:  SINGULAIR TAKE 1 TABLET BY MOUTH AT BEDTIME FOR 14 DAYS   multivitamin tablet Take 1 tablet by mouth daily.   predniSONE 50 MG tablet Commonly known as:  DELTASONE Take 1 tablet (50 mg total) by mouth daily.   VITAMIN D PO Take 4,000 Units by mouth daily.       Known medication allergies: Allergies  Allergen Reactions  . Almond Oil Itching and Other (See Comments)     Flushing   . Chocolate Hives and Itching    If she eats too much     Physical examination: Blood pressure 116/70, pulse 89, temperature 98.1 F (36.7 C), temperature source Tympanic, resp. rate 16, height 5\' 7"  (1.702 m), weight 208 lb (94.3 kg), SpO2 97 %.  General: Alert, interactive, in no acute distress. HEENT: PERRLA, TMs pearly gray, turbinates minimally edematous without discharge, post-pharynx non erythematous. Neck: Supple without lymphadenopathy. Lungs: Clear to auscultation without wheezing, rhonchi or rales. {no increased work of breathing. CV: Normal S1, S2 without murmurs. Abdomen: Nondistended, nontender. Skin: Warm and dry, without  lesions or rashes. Extremities:  No clubbing, cyanosis or edema. Neuro:   Grossly intact.  Diagnositics/Labs:  Allergy testing: Environmental allergy skin prick testing is negative. Select food allergy skin prick testing is positive to almond and beef Allergy testing results were read and interpreted by provider, documented by clinical staff.   Assessment and plan:   Allergic reaction  - skin testing today to select foods is positive to almond and beef  - will obtain tryptase level as well as alpha gal panel to assess for possible red meat allergy.  Will need to rule this out as red meat is in your diet and this can cause delayed symptoms of allergic reaction after ingestion.  A reaction following red meat ingestion does not have to occur after every ingestion; it is not consistent.    - continue avoidance of almonds and red meat products until labs return  - have access to self-injectable epinephrine Epipen or AuviQ 0.3mg  at all times  - follow emergency action plan in case of allergic reaction  Urticaria  - acute onset of urticaria  - food testing as above  - environmental allergy testing is negative.   - at this time etiology of hives and swelling is unknown.  Hives can be caused by a variety of different triggers including  illness/infection, foods, medications, stings, exercise, pressure, vibrations, extremes of temperature to name a few however majority of the time there is no identifiable trigger.    - we will obtain IgE levels for red and yellow dye and environmental allergy panel via serum IgE  - if hives return a journal is to be kept recording any foods eaten, beverages consumed, medications taken, activities performed, and environmental conditions within a 6 hour time period prior to the onset of symptoms. For any symptoms concerning for anaphylaxis, epinephrine is to be administered and 911 is to be called immediately.    -If hives return while taking daily Zyrtec increase to Zyrtec 1 tablet twice a day and add in Pepcid 20 mg 1 tablet twice a day.  Environmental allergy  - testing as above  - zyrtec as above  - for itchy, watery or red eyes use Pazeo 1 drop each eye daily as needed  - for nasal congestion and drainage recommend use of nasal steroid spray like over-the-counter Flonase, Rhinocort or Nasacort 2 sprays each nostril daily for 1 to 2 weeks at a time before stopping once symptoms improve  Follow-up in 4 to 6 months or sooner if needed  I appreciate the opportunity to take part in Amil's care. Please do not hesitate to contact me with questions.  Sincerely,   Prudy Feeler, MD Allergy/Immunology [facility]

## 2018-11-10 NOTE — Patient Instructions (Addendum)
Allergic reaction  - skin testing today to select foods is positive to almond and beef  - will obtain tryptase level as well as alpha gal panel to assess for possible red meat allergy.  Will need to rule this out as red meat is in your diet and this can cause delayed symptoms of allergic reaction after ingestion.  A reaction following red meat ingestion does not have to occur after every ingestion; it is not consistent.    - continue avoidance of almonds and red meat products until labs return  - have access to self-injectable epinephrine Epipen or AuviQ 0.3mg  at all times  - follow emergency action plan in case of allergic reaction  Hives  - acute onset of hives  - food testing as above  - environmental allergy testing is negative.   - at this time etiology of hives and swelling is unknown.  Hives can be caused by a variety of different triggers including illness/infection, foods, medications, stings, exercise, pressure, vibrations, extremes of temperature to name a few however majority of the time there is no identifiable trigger.    - we will obtain IgE levels for red and yellow dye and environmental allergy panel via serum IgE  - if hives return a journal is to be kept recording any foods eaten, beverages consumed, medications taken, activities performed, and environmental conditions within a 6 hour time period prior to the onset of symptoms. For any symptoms concerning for anaphylaxis, epinephrine is to be administered and 911 is to be called immediately.    -If hives return while taking daily Zyrtec increase to Zyrtec 1 tablet twice a day and add in Pepcid 20 mg 1 tablet twice a day.  Environmental allergy  - testing as above  - zyrtec as above  - for itchy, watery or red eyes use Pazeo 1 drop each eye daily as needed  - for nasal congestion and drainage recommend use of nasal steroid spray like over-the-counter Flonase, Rhinocort or Nasacort 2 sprays each nostril daily for 1 to 2 weeks at  a time before stopping once symptoms improve  Follow-up in 4 to 6 months or sooner if needed

## 2018-11-15 LAB — ALLERGENS W/TOTAL IGE AREA 2

## 2018-11-15 LAB — ALLERGEN, ANNATTO SEED
Annatto Seed IgE*: <0.35 kU/L (ref <0.35)
Class Interpretation: 0

## 2018-11-15 LAB — TRYPTASE: Tryptase: 6.9 ug/L (ref 2.2–13.2)

## 2018-11-15 LAB — ALPHA-GAL PANEL
Alpha Gal IgE*: <0.1 kU/L (ref <0.10)
Beef (Bos spp) IgE: <0.1 kU/L (ref <0.35)
Class Interpretation: 0
Class Interpretation: 0
Class Interpretation: 0
Lamb/Mutton (Ovis spp) IgE: <0.1 kU/L (ref <0.35)
Pork (Sus spp) IgE: <0.1 kU/L (ref <0.35)

## 2018-11-15 LAB — ALLERGEN, RED (CARMINE) DYE, RF340: F340-IgE Carmine Red Dye: <0.1 kU/L

## 2019-01-18 IMAGING — MR MR ABDOMEN WO/W CM MRCP
13 of 19 series · 31 of 48 positions shown · IV contrast (19ml Multihance)
Comparison: CT scan 12/24/2017.

CLINICAL DATA: Anterior abdominal pain in the epigastric region.
Prior cholecystectomy 0001.

EXAM:
MRI ABDOMEN WITHOUT AND WITH CONTRAST (INCLUDING MRCP)
TECHNIQUE: Multiplanar multisequence MR imaging of the abdomen was performed
both before and after the administration of intravenous contrast.
Heavily T2-weighted images of the biliary and pancreatic ducts were
obtained, and three-dimensional MRCP images were rendered by post
processing.
CONTRAST:  19mL MULTIHANCE GADOBENATE DIMEGLUMINE 529 MG/ML IV SOLN

[Series 3: T2 · coronal · 5.0mm · 1.41mm/px · 2 of 24 slices shown (1 of 3)]
[im 1/24]
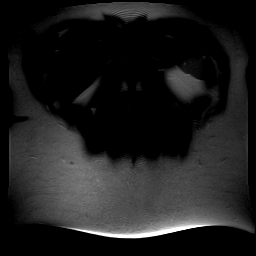
[im 24/24]
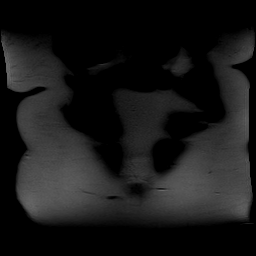

[Series 4: T2 · axial · 6.0mm · 1.41mm/px · z∈[-53,+147]mm · 2 of 30 slices shown (2 of 3)]
[im 1/30]
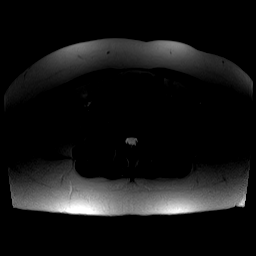
[im 30/30]
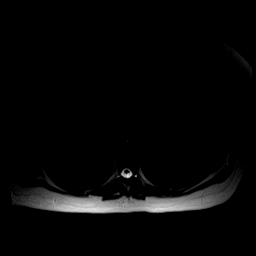

[Series 7: axial in out · axial · 6.0mm · 0.70mm/px · z∈[-53,+147]mm · 4 of 60 slices shown]
[im 1/60]
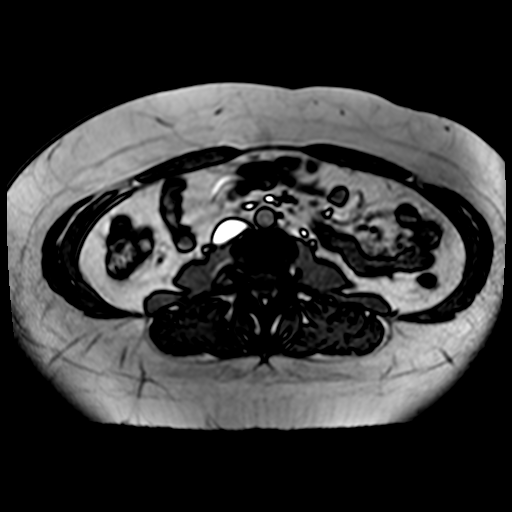
[im 20/60]
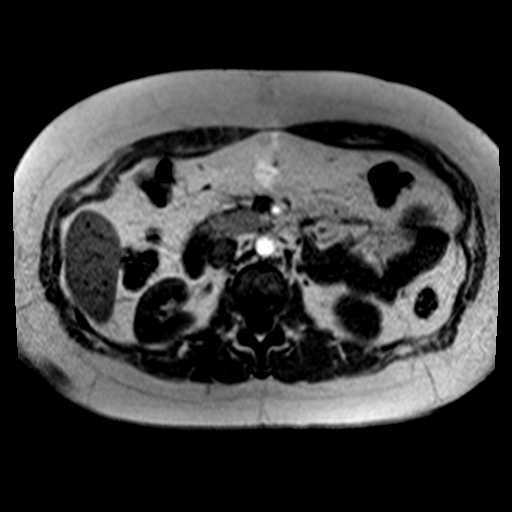
[im 40/60]
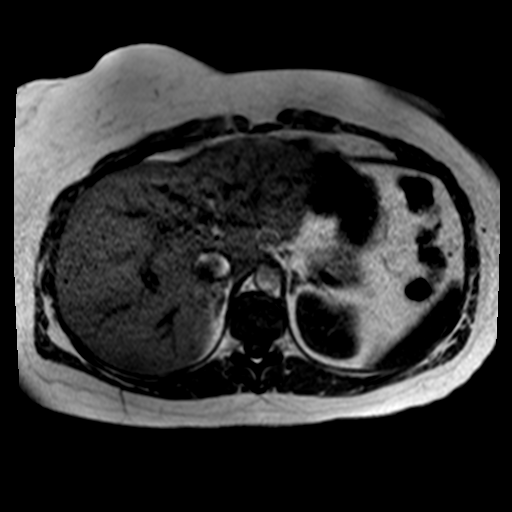
[im 60/60]
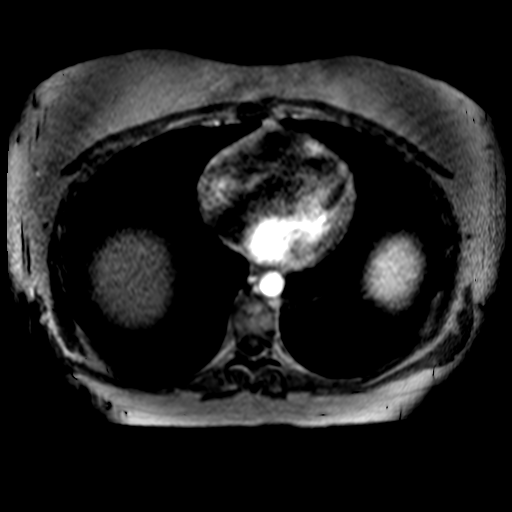

[Series 8: axial tru fisp · axial · 5.0mm · 1.41mm/px · z∈[-56,+151]mm · 2 of 37 slices shown]
[im 1/37]
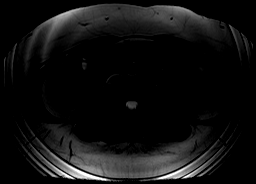
[im 37/37]
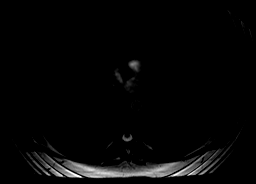

[Series 9: T2 · axial · 6.0mm · 0.70mm/px · 1 of 30 slices shown (3 of 3)]
[im 1/30]
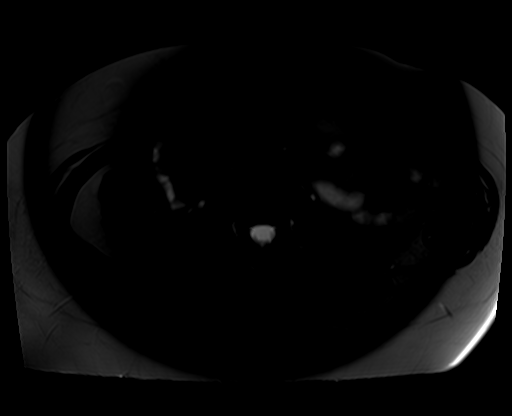

[Series 11: [id number]_800_p2 · axial · 6.0mm · 1.88mm/px · z∈[-53,+147]mm · 4 of 90 slices shown]
[im 1/90]
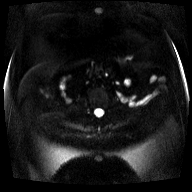
[im 30/90]
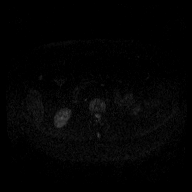
[im 60/90]
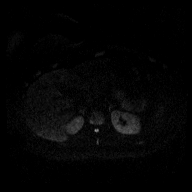
[im 90/90]
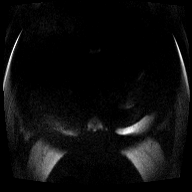

[Series 12: [id number]_adc · axial · 6.0mm · 1.88mm/px · 1 of 30 slices shown]
[im 1/30]
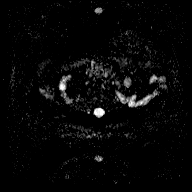

[Series 15: cor haste fs · coronal · 3.0mm · 0.70mm/px · 1 of 19 slices shown]
[im 1/19]
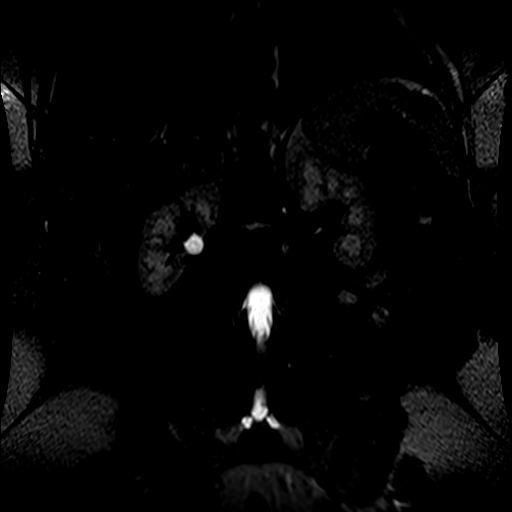

[Series 16: T1 dynamic · axial · non-contrast · 2.5mm · 0.70mm/px · z∈[-55,+142]mm · 3 of 80 slices shown]
[im 1/80]
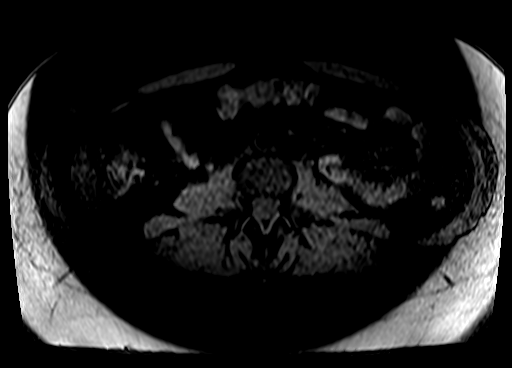
[im 40/80]
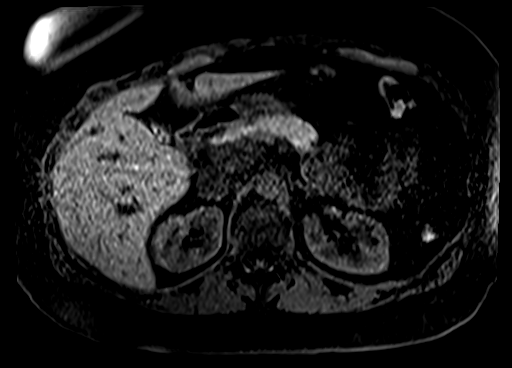
[im 80/80]
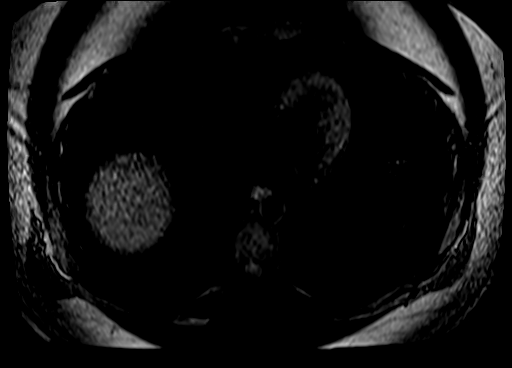

[Series 17: post 25 sec · axial · 2.5mm · 0.70mm/px · z∈[-55,+142]mm · 3 of 80 slices shown]
[im 1/80]
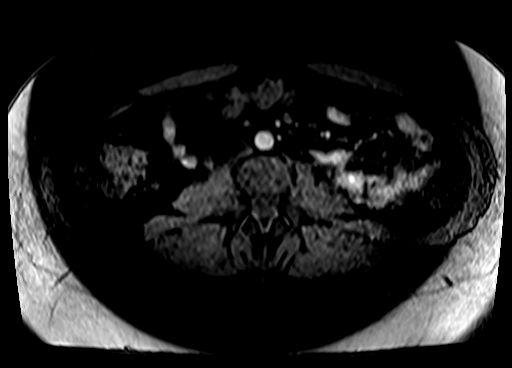
[im 40/80]
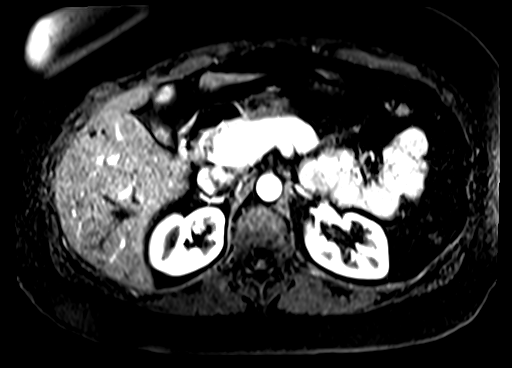
[im 80/80]
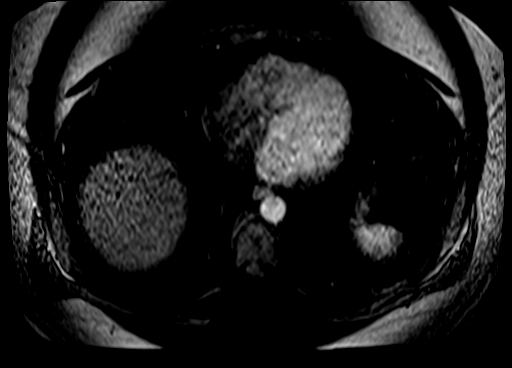

[Series 18: post 25 sec_sub · axial · 2.5mm · 0.70mm/px · z∈[-55,+142]mm · 3 of 80 slices shown]
[im 1/80]
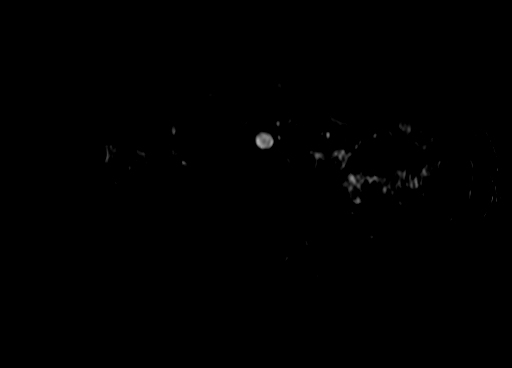
[im 40/80]
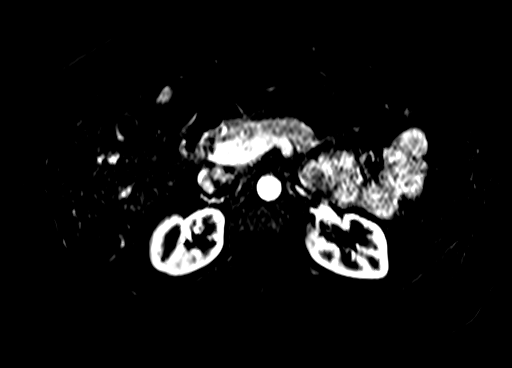
[im 80/80]
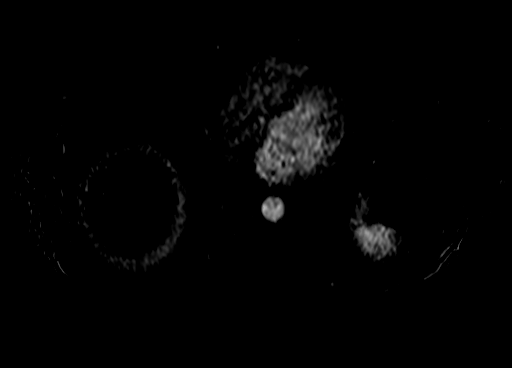

[Series 19: post 45 sec · axial · 2.5mm · 0.70mm/px · z∈[-55,+142]mm · 3 of 80 slices shown]
[im 1/80]
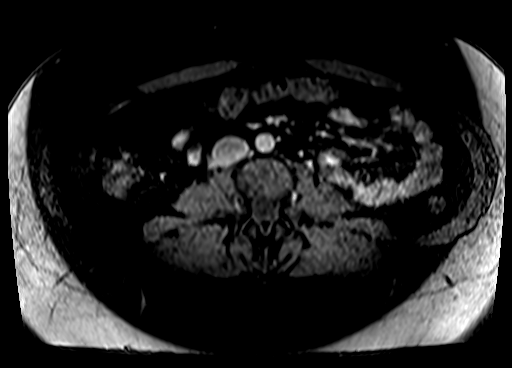
[im 40/80]
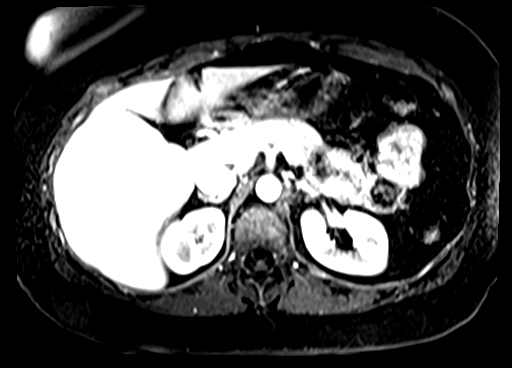
[im 80/80]
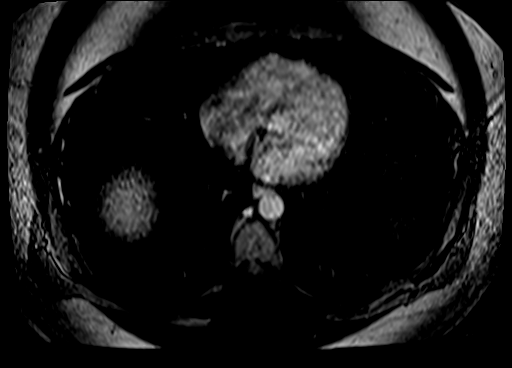

[Series 20: post 45 sec_sub · axial · 2.5mm · 0.70mm/px · z∈[-55,+42]mm · 2 of 80 slices shown]
[im 1/80]
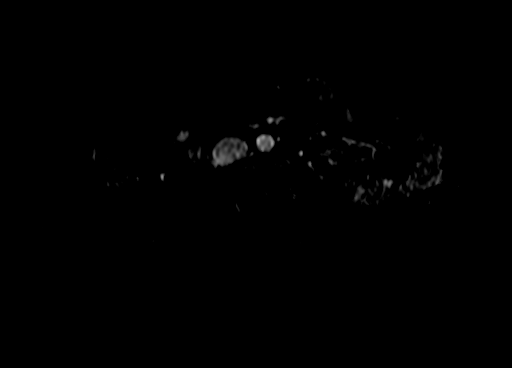
[im 40/80]
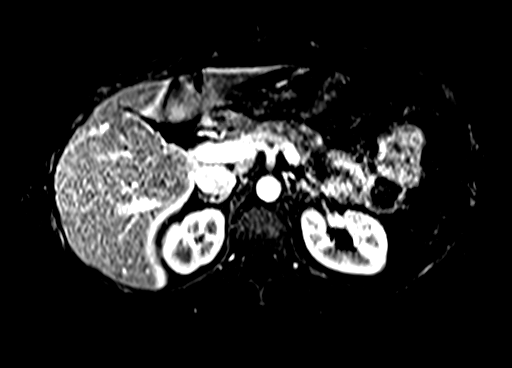

[31 of 48 positions shown; findings below may reference images not displayed]

FINDINGS: Lower chest: Unremarkable.

Hepatobiliary: Liver measures 17.5 cm craniocaudal length. 7 mm
simple cyst noted posterior right liver. Gallbladder surgically
absent. No intrahepatic biliary duct dilatation. Common bile duct
measures 3 mm diameter. No choledocholithiasis.

Pancreas: No focal mass lesion. No dilatation of the main duct. No
intraparenchymal cyst. No peripancreatic edema.

Spleen:  No splenomegaly. No focal mass lesion.

Adrenals/Urinary Tract: No adrenal nodule or mass. 7 mm cyst
identified in the interpolar right kidney and signal characteristics
are compatible with Bosniak II category. Left kidney unremarkable.

Stomach/Bowel: Stomach is nondistended. No gastric wall thickening.
No evidence of outlet obstruction. Duodenum is normally positioned
as is the ligament of Treitz. Small bowel loops and visualized
abdominal segments of the colon are unremarkable.

Vascular/Lymphatic: No abdominal aortic aneurysm. No abdominal
lymphadenopathy.

Other:  No intraperitoneal free fluid.

Musculoskeletal: No abnormal marrow enhancement within the
visualized bony anatomy. No evidence for ventral hernia within the
visualize portion of the abdomen.
IMPRESSION: Unremarkable study. No findings to explain the patient's history of
anterior abdominal pain.

## 2019-04-11 ENCOUNTER — Emergency Department (HOSPITAL_COMMUNITY)
Admission: EM | Admit: 2019-04-11 | Discharge: 2019-04-12 | Disposition: A | Discharge status: Home / Self Care | Payer: BC Managed Care – PPO | Attending: Emergency Medicine | Admitting: Emergency Medicine

## 2019-04-11 ENCOUNTER — Other Ambulatory Visit: Payer: Self-pay

## 2019-04-11 ENCOUNTER — Encounter (HOSPITAL_COMMUNITY): Payer: Self-pay | Admitting: Emergency Medicine

## 2019-04-11 DIAGNOSIS — N83519 Torsion of ovary and ovarian pedicle, unspecified side: Secondary | ICD-10-CM

## 2019-04-11 DIAGNOSIS — R102 Pelvic and perineal pain: Secondary | ICD-10-CM | POA: Insufficient documentation

## 2019-04-11 DIAGNOSIS — Z79899 Other long term (current) drug therapy: Secondary | ICD-10-CM | POA: Diagnosis not present

## 2019-04-11 DIAGNOSIS — N3000 Acute cystitis without hematuria: Secondary | ICD-10-CM | POA: Diagnosis not present

## 2019-04-11 DIAGNOSIS — R109 Unspecified abdominal pain: Secondary | ICD-10-CM

## 2019-04-11 DIAGNOSIS — R1030 Lower abdominal pain, unspecified: Secondary | ICD-10-CM | POA: Diagnosis not present

## 2019-04-11 DIAGNOSIS — R1032 Left lower quadrant pain: Secondary | ICD-10-CM | POA: Diagnosis not present

## 2019-04-11 LAB — COMPREHENSIVE METABOLIC PANEL
ALT: 35 U/L (ref 0–44)
AST: 26 U/L (ref 15–41)
Albumin: UNDETERMINED g/dL (ref 3.5–5.0)
Alkaline Phosphatase: 93 U/L (ref 38–126)
Anion gap: 11 (ref 5–15)
BUN: 7 mg/dL (ref 6–20)
CO2: 21 mmol/L — ABNORMAL LOW (ref 22–32)
Calcium: 8.4 mg/dL — ABNORMAL LOW (ref 8.9–10.3)
Chloride: 107 mmol/L (ref 98–111)
Creatinine, Ser: UNDETERMINED mg/dL (ref 0.44–1.00)
Glucose, Bld: UNDETERMINED mg/dL (ref 70–99)
Potassium: 4.4 mmol/L (ref 3.5–5.1)
Sodium: 139 mmol/L (ref 135–145)
Total Bilirubin: 1.1 mg/dL (ref 0.3–1.2)
Total Protein: UNDETERMINED g/dL (ref 6.5–8.1)

## 2019-04-11 LAB — URINALYSIS, ROUTINE W REFLEX MICROSCOPIC
Bilirubin Urine: NEGATIVE
Glucose, UA: NEGATIVE mg/dL
Hgb urine dipstick: NEGATIVE
Ketones, ur: NEGATIVE mg/dL
Nitrite: NEGATIVE
Protein, ur: NEGATIVE mg/dL
Specific Gravity, Urine: 1.012 (ref 1.005–1.030)
WBC, UA: >50 WBC/hpf — ABNORMAL HIGH (ref 0–5)
pH: 6 (ref 5.0–8.0)

## 2019-04-11 LAB — CBC
HCT: 52.7 % — ABNORMAL HIGH (ref 36.0–46.0)
Hemoglobin: 15.3 g/dL — ABNORMAL HIGH (ref 12.0–15.0)
MCH: 27.4 pg (ref 26.0–34.0)
MCHC: 29 g/dL — ABNORMAL LOW (ref 30.0–36.0)
MCV: 94.4 fL (ref 80.0–100.0)
Platelets: 48 10*3/uL — ABNORMAL LOW (ref 150–400)
RBC: 5.58 MIL/uL — ABNORMAL HIGH (ref 3.87–5.11)
RDW: 13.2 % (ref 11.5–15.5)
WBC: 7.8 10*3/uL (ref 4.0–10.5)
nRBC: 0 % (ref 0.0–0.2)

## 2019-04-11 LAB — I-STAT BETA HCG BLOOD, ED (MC, WL, AP ONLY): I-stat hCG, quantitative: <5 m[IU]/mL (ref <5)

## 2019-04-11 LAB — LIPASE, BLOOD: Lipase: 29 U/L (ref 11–51)

## 2019-04-11 MED ORDER — SODIUM CHLORIDE 0.9% FLUSH: 3.0000 mL | Freq: Once | INTRAVENOUS | Status: DC

## 2019-04-11 NOTE — ED Triage Notes (Signed)
Pt states she feels like she had an ovarian cyst burst. She has had one burst in the past. Pt had really bad lower abd pain, felt clammy, nauseated and vomited. Pt states the pain has subsided but still aches.

## 2019-04-12 ENCOUNTER — Emergency Department (HOSPITAL_COMMUNITY): Payer: BC Managed Care – PPO

## 2019-04-12 DIAGNOSIS — R102 Pelvic and perineal pain: Secondary | ICD-10-CM | POA: Diagnosis not present

## 2019-04-12 MED ORDER — CEPHALEXIN 250 MG PO CAPS
1000.0000 mg | ORAL_CAPSULE | Freq: Once | ORAL | Status: AC
  Administered 2019-04-12: 1000 mg via ORAL
  Filled 2019-04-12: qty 4

## 2019-04-12 MED ORDER — CEPHALEXIN 500 MG PO CAPS: 500.0000 mg | ORAL_CAPSULE | Freq: Four times a day (QID) | ORAL | 0 refills | Status: DC

## 2019-04-12 NOTE — ED Notes (Signed)
Patient transported to Ultrasound 

## 2019-04-12 NOTE — ED Provider Notes (Signed)
[facility] DEPARTMENT Provider Note   CSN: [id number] Arrival date & time: 04/11/19  1458     History   Chief Complaint Chief Complaint  Patient presents with   Abdominal Pain    HPI Sarah Simmons is a 38 y.o. female.     The history is provided by the patient.  Abdominal Pain Pain location:  Suprapubic and LLQ Pain quality: aching and sharp   Pain severity:  Mild Onset quality:  Sudden Timing:  Constant Progression:  Improving Chronicity:  New Relieved by:  None tried Worsened by:  Nothing Ineffective treatments:  None tried Associated symptoms: no dysuria and no fatigue     Past Medical History:  Diagnosis Date   Acoustic neuroma ([place])    Anemia    Angio-edema    Nephrolithiasis    Papanicolaou smear of cervix with low risk human papillomavirus (HPV) DNA test positive 12/01/2017   Repeat Pap June 2020   Urticaria     Patient Active Problem List   Diagnosis Date Noted   Urticaria, acute 09/14/2018   D-dimer, elevated 09/14/2018   Acute periumbilical pain 123XX123   Papanicolaou smear of cervix with low risk human papillomavirus (HPV) DNA test positive 12/01/2017   Class 1 obesity due to excess calories without serious comorbidity in adult [id number]   Umbilical hernia without obstruction and without gangrene 11/29/2017   Vitamin D deficiency 11/29/2017   Encounter for post Essure sterilization check 11/29/2017   Tenderness of right axilla 11/20/2016   Axillary tenderness, left 11/20/2016   Fatigue 11/20/2016   Unintentional weight loss 11/20/2016   Deaf, right 01/14/2016   Right-sided vestibular weakness 01/14/2016   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   Chest wall pain 12/04/2014   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 Authorizing Provider  cephALEXin (KEFLEX) 500 MG capsule Take 1 capsule (500 mg total) by mouth 4 (four) times daily. 04/12/19   Noraa Pickeral, Corene Cornea, MD  cetirizine (ZYRTEC) 10 MG tablet Take 1 tablet (10 mg total) by mouth daily. 10/10/18   Trixie Dredge, PA-C  diphenhydrAMINE (BENADRYL) 25 MG tablet Take 1 tablet (25 mg total) by mouth every 6 (six) hours. 09/12/18   Dorie Rank, MD  EPINEPHrine (EPIPEN 2-PAK) 0.3 mg/0.3 mL IJ SOAJ injection Inject 0.3 mLs (0.3 mg total) into the muscle as needed for anaphylaxis. 09/12/18   Dorie Rank, MD  esomeprazole (NEXIUM) 40 MG capsule Take 1 capsule (40 mg total) by mouth daily at 12 noon.  Patient not taking: Reported on 11/10/2018 12/23/17   Trixie Dredge, PA-C  famotidine (PEPCID) 20 MG tablet Take 1 tablet (20 mg total) by mouth 2 (two) times daily for 14 days. 09/14/18 09/28/18  Trixie Dredge, PA-C  famotidine (PEPCID) 20 MG tablet Take 1 tablet (20 mg total) by mouth 2 (two) times daily. 11/10/18   Kennith Gain, MD  loratadine-pseudoephedrine (CLARITIN-D 12-HOUR) 5-120 MG tablet Take 1 tablet by mouth daily as needed for allergies.    [provider]  montelukast (SINGULAIR) 10 MG tablet TAKE 1 TABLET BY MOUTH AT BEDTIME FOR 14 DAYS Patient not taking: Reported on 11/10/2018 10/06/18   Trixie Dredge, PA-C  Multiple Vitamin (MULTIVITAMIN) tablet Take 1 tablet by mouth daily.    [provider]  Olopatadine HCl (PAZEO) 0.7 % SOLN  Place 1 drop into both eyes daily as needed. 11/10/18   Kennith Gain, MD  predniSONE (DELTASONE) 50 MG tablet Take 1 tablet (50 mg total) by mouth daily. Patient not taking: Reported on 11/10/2018 09/12/18   Dorie Rank, MD  VITAMIN D PO Take 4,000 Units by mouth daily.    [provider]    Family History Family History  Problem Relation Age of Onset   Uterine cancer Sister    Allergic rhinitis Sister    Liver cancer Father    Cancer Father    Allergic rhinitis Father    Allergic rhinitis Mother    Transient ischemic attack Other        grandparents   Hypothyroidism Maternal Grandmother    Transient ischemic attack Maternal Grandfather    Transient ischemic attack Paternal Grandfather     Social History Social History   Tobacco Use   Smoking status: Never Smoker   Smokeless tobacco: Never Used  Substance Use Topics   Alcohol use: Yes    Alcohol/week: 0.0 standard drinks    Comment: rarely   Drug use: No     Allergies   Almond oil and Chocolate   Review of Systems Review of Systems  Constitutional: Negative for fatigue.  Gastrointestinal: Positive for abdominal pain.  Genitourinary: Negative for dysuria.  All other systems reviewed and are negative.    Physical Exam Updated Vital Signs BP 124/78    Pulse 83    Temp 98.9 F (37.2 C) (Oral)    Resp 18    Ht 5\' 8"  (1.727 m)    Wt 95.3 kg    LMP 03/26/2019    SpO2 100%    BMI 31.93 kg/m   Physical Exam Vitals signs and nursing note reviewed.  Constitutional:      Appearance: She is well-developed.  HENT:     Head: Normocephalic and atraumatic.     Nose: Nose normal. No congestion or rhinorrhea.  Eyes:     Pupils: Pupils are equal, round, and reactive to light.  Neck:     Musculoskeletal: Normal range of motion.  Cardiovascular:     Rate and Rhythm: Normal rate and regular rhythm.  Pulmonary:     Effort: Pulmonary effort is normal. No respiratory distress.     Breath  sounds: No stridor.  Abdominal:     General: There is no distension.  Musculoskeletal: Normal range of motion.        General: No swelling or tenderness.  Neurological:     General: No focal deficit present.     Mental Status: She is alert.      ED Treatments / Results  Labs (all labs ordered are listed, but only abnormal results are displayed) Labs Reviewed  COMPREHENSIVE METABOLIC PANEL - Abnormal; Notable for the following components:      Result Value   CO2 21 (*)    Calcium 8.4 (*)    All other components within normal limits  CBC - Abnormal; Notable for the following components:   RBC 5.58 (*)    Hemoglobin 15.3 (*)    HCT 52.7 (*)    MCHC 29.0 (*)    Platelets 48 (*)    All other components within normal limits  URINALYSIS, ROUTINE W REFLEX MICROSCOPIC - Abnormal; Notable for the following components:   APPearance CLOUDY (*)    Leukocytes,Ua LARGE (*)    WBC, UA >50 (*)    Bacteria, UA MANY (*)    All other components within normal limits  URINE CULTURE  LIPASE, BLOOD  I-STAT BETA HCG BLOOD, ED ([facility], [facility], [facility] ONLY)    EKG None  Radiology US Pelvic Doppler (torsion R/o Or Mass Arterial Flow)  Result Date: 04/12/2019 CLINICAL DATA:  Pelvic pain EXAM: TRANSABDOMINAL AND TRANSVAGINAL ULTRASOUND OF PELVIS DOPPLER ULTRASOUND OF OVARIES TECHNIQUE: Both transabdominal and transvaginal ultrasound examinations of the pelvis were performed. Transabdominal technique was performed for global imaging of the pelvis including uterus, ovaries, adnexal regions, and pelvic cul-de-sac. It was necessary to proceed with endovaginal exam following the transabdominal exam to visualize the ovaries. Color and duplex Doppler ultrasound was utilized to evaluate blood flow to the ovaries. COMPARISON:  None. FINDINGS: Uterus Measurements: 6 x 4.4 x 5.1 cm = volume: 71 mL. No fibroids or other mass visualized. Endometrium Thickness: 10 mm.  No focal abnormality visualized. Right ovary Measurements:  3.5 x 2.3 x 3.2 cm = volume: 13.5 mL. Normal appearance/no adnexal mass. Left ovary Measurements: 3.5 x 2.3 x 1.7 cm = volume: 7 mL. Normal appearance/no adnexal mass. Pulsed Doppler evaluation of both ovaries demonstrates normal low-resistance arterial and venous waveforms. Other findings No abnormal free fluid. IMPRESSION: Normal study. Electronically Signed   By: Constance Holster M.D.   On: 04/12/2019 05:18   US Pelvic Complete With Transvaginal  Result Date: 04/12/2019 CLINICAL DATA:  Pelvic pain EXAM: TRANSABDOMINAL AND TRANSVAGINAL ULTRASOUND OF PELVIS DOPPLER ULTRASOUND OF OVARIES TECHNIQUE: Both transabdominal and transvaginal ultrasound examinations of the pelvis were performed. Transabdominal technique was performed for global imaging of the pelvis including uterus, ovaries, adnexal regions, and pelvic cul-de-sac. It was necessary to proceed with endovaginal exam following the transabdominal exam to visualize the ovaries. Color and duplex Doppler ultrasound was utilized to evaluate blood flow to the ovaries. COMPARISON:  None. FINDINGS: Uterus Measurements: 6 x 4.4 x 5.1 cm = volume: 71 mL. No fibroids or other mass visualized. Endometrium Thickness: 10 mm.  No focal abnormality visualized. Right ovary Measurements: 3.5 x 2.3 x 3.2 cm = volume: 13.5 mL. Normal appearance/no adnexal mass. Left ovary Measurements: 3.5 x 2.3 x 1.7 cm = volume: 7 mL. Normal appearance/no adnexal mass. Pulsed Doppler evaluation of both ovaries demonstrates normal low-resistance arterial and venous waveforms. Other findings No abnormal free fluid. IMPRESSION: Normal study. Electronically Signed   By: Constance Holster M.D.   On: 04/12/2019 05:18    Procedures Procedures (including critical care time)  Medications Ordered in ED Medications  sodium chloride flush (NS) 0.9 % injection 3 mL (has no administration in time range)  cephALEXin (KEFLEX) capsule 1,000 mg (1,000 mg Oral Given 04/12/19 0532)     Initial  Impression / Assessment  and Plan / ED Course  I have reviewed the triage vital signs and the nursing notes.  Pertinent labs & imaging results that were available during my care of the patient were reviewed by me and considered in my medical decision making (see chart for details).  Here with abdominal pain. Workup as above. Pain improved. Will treat for UTI pending culture.   Final Clinical Impressions(s) / ED Diagnoses   Final diagnoses:  Abdominal pain  Lower abdominal pain  Acute cystitis without hematuria    ED Discharge Orders         Ordered    cephALEXin (KEFLEX) 500 MG capsule  4 times daily     04/12/19 0525           Kemberly Taves, Corene Cornea, MD 04/12/19 [id number]

## 2019-04-13 LAB — URINE CULTURE

## 2019-04-19 ENCOUNTER — Other Ambulatory Visit (HOSPITAL_COMMUNITY)
Admission: RE | Admit: 2019-04-19 | Discharge: 2019-04-19 | Disposition: A | Discharge status: Home / Self Care | Payer: BC Managed Care – PPO | Source: Ambulatory Visit | Attending: Physician Assistant | Admitting: Physician Assistant

## 2019-04-19 ENCOUNTER — Other Ambulatory Visit: Payer: Self-pay

## 2019-04-19 ENCOUNTER — Ambulatory Visit (INDEPENDENT_AMBULATORY_CARE_PROVIDER_SITE_OTHER): Payer: BC Managed Care – PPO | Admitting: Physician Assistant

## 2019-04-19 VITALS — BP 111/42 | HR 86 | Temp 98.8°F | Ht 68.5 in | Wt 217.0 lb

## 2019-04-19 DIAGNOSIS — R87820 Cervical low risk human papillomavirus (HPV) DNA test positive: Secondary | ICD-10-CM | POA: Diagnosis not present

## 2019-04-19 DIAGNOSIS — R1030 Lower abdominal pain, unspecified: Secondary | ICD-10-CM | POA: Insufficient documentation

## 2019-04-19 DIAGNOSIS — Z124 Encounter for screening for malignant neoplasm of cervix: Secondary | ICD-10-CM

## 2019-04-19 DIAGNOSIS — Z23 Encounter for immunization: Secondary | ICD-10-CM

## 2019-04-19 LAB — POCT URINALYSIS DIPSTICK
Bilirubin, UA: NEGATIVE
Blood, UA: NEGATIVE
Glucose, UA: NEGATIVE
Ketones, UA: NEGATIVE
Leukocytes, UA: NEGATIVE
Nitrite, UA: NEGATIVE
Protein, UA: NEGATIVE
Spec Grav, UA: >=1.03 — AB (ref 1.010–1.025)
Urobilinogen, UA: 0.2 E.U./dL
pH, UA: 6 (ref 5.0–8.0)

## 2019-04-19 MED ORDER — PHENAZOPYRIDINE HCL 200 MG PO TABS: 200.0000 mg | ORAL_TABLET | Freq: Three times a day (TID) | ORAL | 0 refills | Status: AC

## 2019-04-19 NOTE — Progress Notes (Signed)
Subjective:    Patient ID: Sarah Simmons, female    DOB: 21-Apr-1981, 38 y.o.   MRN: [id number]  HPI  Pt is a 38 yo female who presents to the clinic to follow up on lower abdominal pain. Pt was seen in ED on 11/2 for sudden sharp suprapubic and left lower quadrant pain. WBC, lipase was normal. Normal pelvic u/s with evidence of torsion/cysts. UA had bacteria. Treated with keflex for UTI. Her symptoms are not any better. Still reports sharp and achy lower abdominal pain. She denies any changes in bowel movements and has soft regular stools. Denies any melena or hematochezia. Nothing seems to make better. Pain is same no matter what. Not noticed any vaginal discharge or odor. She is sexually active. Not currently having sex.   Hx of HPV on pap. Last pap one year ago.   .. Active Ambulatory Problems    Diagnosis Date Noted  . Hair loss 03/20/2014  . Right facial numbness 03/20/2014  . Right-sided sensorineural hearing loss 03/20/2014  . 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 Chest wall pain 12/04/2014  . 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 Deaf, right 01/14/2016  . Right-sided vestibular weakness 01/14/2016  . Tenderness of right axilla 11/20/2016  . Axillary tenderness, left 11/20/2016  . Fatigue 11/20/2016  . Unintentional weight loss 11/20/2016  . Class 1 obesity due to excess calories without serious comorbidity in adult 11/29/2017  . Umbilical hernia without obstruction and without gangrene 11/29/2017  . Vitamin D deficiency 11/29/2017  . Encounter for post Essure sterilization check 11/29/2017  . Papanicolaou smear of cervix with low risk human papillomavirus (HPV) DNA test positive 12/01/2017  . Acute periumbilical pain 123XX123  . Urticaria, acute 09/14/2018  . D-dimer, elevated 09/14/2018   Resolved Ambulatory Problems    Diagnosis Date Noted  . No Resolved Ambulatory Problems   Past Medical History:  Diagnosis Date  . Acoustic  neuroma ([place])   . Anemia   . Angio-edema   . Nephrolithiasis   . Urticaria       Review of Systems See HPI.     Objective:   Physical Exam Vitals signs reviewed.  Constitutional:      Appearance: She is well-developed.  HENT:     Head: Normocephalic.  Cardiovascular:     Rate and Rhythm: Normal rate and regular rhythm.     Heart sounds: Normal heart sounds.  Pulmonary:     Effort: Pulmonary effort is normal.     Breath sounds: Normal breath sounds.  Abdominal:     General: Bowel sounds are normal. There is no distension.     Palpations: Abdomen is soft.     Tenderness: There is abdominal tenderness in the suprapubic area and left lower quadrant. There is no right CVA tenderness or left CVA tenderness.     Hernia: No hernia is present.  Genitourinary:    Vagina: Vaginal discharge present.     Cervix: Discharge and friability present.     Uterus: Normal.      Adnexa:        Right: Tenderness present. No mass.         Left: Tenderness present. No mass.    Neurological:     General: No focal deficit present.     Mental Status: She is alert.  Psychiatric:        Mood and Affect: Mood normal.           Assessment &  Plan:  .Lender was seen today for abdominal pain.  Diagnoses and all orders for this visit:  Lower abdominal pain -     POCT urinalysis dipstick -     Urine Culture -     phenazopyridine (PYRIDIUM) 200 MG tablet; Take 1 tablet (200 mg total) by mouth 3 (three) times daily for 2 days. -     Cytology - PAP -     C. trachomatis/N. gonorrhoeae RNA  Flu vaccine need -     REGULAR FLU SHOT  Need for Tdap vaccination -     TDAP  Papanicolaou smear -     Cytology - PAP   UA dipstick seemed to clear.  Will cutlure.  Pain persistent. Will order CT of abdomen.  Due for pap. Done today.  Cervix was friable with active discharge. All STD order to rule out PID.  Pyridium given for any bladder inflammation.  Follow up as needed or if symptoms worsen.

## 2019-04-19 NOTE — Patient Instructions (Signed)
Will call for CT of abdomen.  Will call with pap results.  Start pyridium.

## 2019-04-21 ENCOUNTER — Encounter: Payer: Self-pay | Admitting: Physician Assistant

## 2019-04-21 LAB — URINE CULTURE
MICRO NUMBER:: 1089781
Result:: NO GROWTH
SPECIMEN QUALITY:: ADEQUATE

## 2019-04-21 MED ORDER — AZITHROMYCIN 500 MG PO TABS: ORAL_TABLET | ORAL | 0 refills | Status: DC

## 2019-04-21 NOTE — Addendum Note (Signed)
Addended by: Donella Stade on: 04/21/2019 01:48 PM   Modules accepted: Orders

## 2019-04-21 NOTE — Progress Notes (Signed)
Ok I understand. We could have her come in to do GC/Chlamydia separate just to make sure? Kane is there no way to add on testing after pap been sent? Very sorry it was not added.

## 2019-04-21 NOTE — Progress Notes (Signed)
[name],   Urine culture showed no bacteria. No infection present.   Sarah Simmons

## 2019-04-21 NOTE — Progress Notes (Signed)
HPV negative. No abnormal cells. GC/chlamydia was not tested and supposed to be. At this point I would like to just treat you empirically for GC/chlamydia. Can you come in for a shot of rocephin 250mg  IM and will send 1 gram of azithromycin.

## 2019-04-22 LAB — C. TRACHOMATIS/N. GONORRHOEAE RNA

## 2019-04-23 ENCOUNTER — Other Ambulatory Visit: Payer: Self-pay | Admitting: Physician Assistant

## 2019-04-23 DIAGNOSIS — T7840XD Allergy, unspecified, subsequent encounter: Secondary | ICD-10-CM

## 2019-04-23 DIAGNOSIS — L508 Other urticaria: Secondary | ICD-10-CM

## 2019-04-24 NOTE — Progress Notes (Signed)
Urine culture negative. Let patient know STD pending.

## 2019-05-01 LAB — CYTOLOGY - PAP
Chlamydia: NEGATIVE
Comment: NEGATIVE
Comment: NEGATIVE
Comment: NORMAL
Diagnosis: NEGATIVE
High risk HPV: NEGATIVE
Neisseria Gonorrhea: NEGATIVE

## 2019-05-01 NOTE — Progress Notes (Signed)
Negative for GC chlamydia.

## 2019-05-01 NOTE — Progress Notes (Signed)
Yes exactly. That msg was before we knew what the results were.

## 2019-10-18 NOTE — Progress Notes (Signed)
Erroneous encounter. Please disregard.

## 2019-10-19 ENCOUNTER — Encounter: Payer: BC Managed Care – PPO | Admitting: Medical-Surgical

## 2019-10-19 NOTE — Progress Notes (Signed)
   Complete physical exam  Patient: Sarah Simmons   DOB: 03/28/1999   39 y.o. Female  MRN: [id number]  Subjective:    No chief complaint on file.   Sarah Simmons is a 39 y.o. female who presents today for a complete physical exam. She reports consuming a {diet types:17450} diet. {types:19826} She generally feels {DESC; WELL/FAIRLY WELL/POORLY:18703}. She reports sleeping {DESC; WELL/FAIRLY WELL/POORLY:18703}. She {does/does not:200015} have additional problems to discuss today.    Most recent fall risk assessment:    12/03/2021   10:42 AM  Fall Risk   Falls in the past year? 0  Number falls in past yr: 0  Injury with Fall? 0  Risk for fall due to : No Fall Risks  Follow up Falls evaluation completed     Most recent depression screenings:    12/03/2021   10:42 AM 10/24/2020   10:46 AM  PHQ 2/9 Scores  PHQ - 2 Score 0 0  PHQ- 9 Score 5     {VISON DENTAL STD PSA (Optional):27386}  {History (Optional):23778}  Patient Care Team: Brantlee Hinde, NP as PCP - General (Nurse Practitioner)   Outpatient Medications Prior to Visit  Medication Sig   fluticasone (FLONASE) 50 MCG/ACT nasal spray Place 2 sprays into both nostrils in the morning and at bedtime. After 7 days, reduce to once daily.   norgestimate-ethinyl estradiol (SPRINTEC 28) 0.25-35 MG-MCG tablet Take 1 tablet by mouth daily.   Nystatin POWD Apply liberally to affected area 2 times per day   spironolactone (ALDACTONE) 100 MG tablet Take 1 tablet (100 mg total) by mouth daily.   No facility-administered medications prior to visit.    ROS        Objective:     There were no vitals taken for this visit. {Vitals History (Optional):23777}  Physical Exam   No results found for any visits on 01/08/22. {Show previous labs (optional):23779}    Assessment & Plan:    Routine Health Maintenance and Physical Exam  Immunization History  Administered Date(s) Administered   DTaP 06/11/1999, 08/07/1999,  10/16/1999, 07/01/2000, 01/15/2004   Hepatitis A 11/11/2007, 11/16/2008   Hepatitis B 03/29/1999, 05/06/1999, 10/16/1999   HiB (PRP-OMP) 06/11/1999, 08/07/1999, 10/16/1999, 07/01/2000   IPV 06/11/1999, 08/07/1999, 04/05/2000, 01/15/2004   Influenza,inj,Quad PF,6+ Mos 02/16/2014   Influenza-Unspecified 05/18/2012   MMR 04/05/2001, 01/15/2004   Meningococcal Polysaccharide 11/16/2011   Pneumococcal Conjugate-13 07/01/2000   Pneumococcal-Unspecified 10/16/1999, 12/30/1999   Tdap 11/16/2011   Varicella 04/05/2000, 11/11/2007    Health Maintenance  Topic Date Due   HIV Screening  Never done   Hepatitis C Screening  Never done   INFLUENZA VACCINE  01/06/2022   PAP-Cervical Cytology Screening  01/08/2022 (Originally 03/27/2020)   PAP SMEAR-Modifier  01/08/2022 (Originally 03/27/2020)   TETANUS/TDAP  01/08/2022 (Originally 11/15/2021)   HPV VACCINES  Discontinued   COVID-19 Vaccine  Discontinued    Discussed health benefits of physical activity, and encouraged her to engage in regular exercise appropriate for her age and condition.  Problem List Items Addressed This Visit   None Visit Diagnoses     Annual physical exam    -  Primary   Cervical cancer screening       Need for Tdap vaccination          No follow-ups on file.     Shadi Sessler, NP   

## 2019-10-20 ENCOUNTER — Emergency Department (INDEPENDENT_AMBULATORY_CARE_PROVIDER_SITE_OTHER)
Admission: EM | Admit: 2019-10-20 | Discharge: 2019-10-20 | Disposition: A | Discharge status: Home / Self Care | Payer: BC Managed Care – PPO | Source: Home / Self Care

## 2019-10-20 ENCOUNTER — Other Ambulatory Visit: Payer: Self-pay

## 2019-10-20 ENCOUNTER — Ambulatory Visit (INDEPENDENT_AMBULATORY_CARE_PROVIDER_SITE_OTHER): Payer: BC Managed Care – PPO | Admitting: Medical-Surgical

## 2019-10-20 DIAGNOSIS — R300 Dysuria: Secondary | ICD-10-CM | POA: Diagnosis not present

## 2019-10-20 DIAGNOSIS — N3000 Acute cystitis without hematuria: Secondary | ICD-10-CM

## 2019-10-20 DIAGNOSIS — Z5329 Procedure and treatment not carried out because of patient's decision for other reasons: Secondary | ICD-10-CM

## 2019-10-20 LAB — POCT URINALYSIS DIP (MANUAL ENTRY)
Bilirubin, UA: NEGATIVE
Blood, UA: NEGATIVE
Glucose, UA: NEGATIVE mg/dL
Ketones, POC UA: NEGATIVE mg/dL
Nitrite, UA: NEGATIVE
Protein Ur, POC: NEGATIVE mg/dL
Spec Grav, UA: 1.025 (ref 1.010–1.025)
Urobilinogen, UA: 0.2 E.U./dL
pH, UA: 5 (ref 5.0–8.0)

## 2019-10-20 MED ORDER — CEPHALEXIN 500 MG PO CAPS: 500.0000 mg | ORAL_CAPSULE | Freq: Two times a day (BID) | ORAL | 0 refills | Status: DC

## 2019-10-20 NOTE — ED Triage Notes (Signed)
Pt c/o abd pain x 2 weeks. Dysuria x 2 days. Ibuprofen prn. Pain 2/10

## 2019-10-20 NOTE — ED Provider Notes (Signed)
[facility]    CSN: [id number] Arrival date & time: 10/20/19  1749      History   Chief Complaint Chief Complaint  Patient presents with  . Abdominal Pain    Possible uti    HPI Sarah Simmons is a 39 y.o. female.   HPI Sarah Simmons is a 39 y.o. female presenting to UC with c/o mild intermittent abdominal pain for about 2 weeks, dysuria worsening over the last 2 days.  Pain is 2/10. She has been taking ibuprofen as needed. Denies fever, chills, n/v/d.  Last UTI was a few months ago.    Past Medical History:  Diagnosis Date  . Acoustic neuroma ([place])   . Anemia   . Angio-edema   . Nephrolithiasis   . Papanicolaou smear of cervix with low risk human papillomavirus (HPV) DNA test positive 12/01/2017   Repeat Pap June 2020  . Urticaria     Patient Active Problem List   Diagnosis Date Noted  . Urticaria, acute 09/14/2018  . D-dimer, elevated 09/14/2018  . Acute periumbilical pain [id number]  . Papanicolaou smear of cervix with low risk human papillomavirus (HPV) DNA test positive 12/01/2017  . Class 1 obesity due to excess calories without serious comorbidity in adult 11/29/2017  . Umbilical hernia without obstruction and without gangrene 11/29/2017  . Vitamin D deficiency 11/29/2017  . Encounter for post Essure sterilization check 11/29/2017  . Tenderness of right axilla 11/20/2016  . Axillary tenderness, left 11/20/2016  . Fatigue 11/20/2016  . Unintentional weight loss 11/20/2016  . Deaf, right 01/14/2016  . Right-sided vestibular weakness 01/14/2016  . 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 Chest wall pain 12/04/2014  . 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 Hair loss 03/20/2014  . Right facial numbness 03/20/2014  . Right-sided sensorineural hearing loss 03/20/2014    Past Surgical History:  Procedure Laterality Date  . ACOUSTIC NEUROMA RESECTION  12/14/2012   neuroma removal  . CHOLECYSTECTOMY  2010  .  ESSURE TUBAL LIGATION    . EYE SURGERY     x3  . TUBAL LIGATION      OB History    Gravida  3   Para  3   Term      Preterm      AB      Living        SAB      TAB      Ectopic      Multiple      Live Births               Home Medications    Prior to Admission medications   Medication Sig Start Date End Date Taking? Authorizing Provider  cephALEXin (KEFLEX) 500 MG capsule Take 1 capsule (500 mg total) by mouth 2 (two) times daily. 10/20/19   Noe Gens, PA-C  cetirizine (ZYRTEC) 10 MG tablet TAKE 1 TABLET BY MOUTH EVERY DAY 04/24/19   Hali Marry, MD  diphenhydrAMINE (BENADRYL) 25 MG tablet Take 1 tablet (25 mg total) by mouth every 6 (six) hours. 09/12/18   Dorie Rank, MD  EPINEPHrine (EPIPEN 2-PAK) 0.3 mg/0.3 mL IJ SOAJ injection Inject 0.3 mLs (0.3 mg total) into the muscle as needed for anaphylaxis. Patient not taking: Reported on 04/19/2019 09/12/18   Dorie Rank, MD  loratadine-pseudoephedrine (CLARITIN-D 12-HOUR) 5-120 MG tablet Take 1 tablet by mouth daily as needed for allergies.    [provider]  Multiple  Vitamin (MULTIVITAMIN) tablet Take 1 tablet by mouth daily.    [provider]  VITAMIN D PO Take 4,000 Units by mouth daily.    [provider]    Family History Family History  Problem Relation Age of Onset  . Uterine cancer Sister   . Allergic rhinitis Sister   . Liver cancer Father   . Cancer Father   . Allergic rhinitis Father   . Allergic rhinitis Mother   . Transient ischemic attack Other        grandparents  . Hypothyroidism Maternal Grandmother   . Transient ischemic attack Maternal Grandfather   . Transient ischemic attack Paternal Grandfather     Social History Social History   Tobacco Use  . Smoking status: Never Smoker  . Smokeless tobacco: Never Used  Substance Use Topics  . Alcohol use: Yes    Alcohol/week: 0.0 standard drinks    Comment: rarely  . Drug use: No     Allergies    Almond oil and Chocolate   Review of Systems Review of Systems  Constitutional: Negative for chills and fever.  Gastrointestinal: Positive for abdominal pain. Negative for diarrhea, nausea and vomiting.  Genitourinary: Positive for dysuria, frequency, pelvic pain (discomfort) and urgency. Negative for hematuria, vaginal bleeding, vaginal discharge and vaginal pain.  Musculoskeletal: Negative for back pain.  Neurological: Negative for dizziness and headaches.     Physical Exam Triage Vital Signs ED Triage Vitals  Enc Vitals Group     BP 10/20/19 1820 124/79     Pulse Rate 10/20/19 1820 93     Resp 10/20/19 1820 18     Temp 10/20/19 1820 98.8 F (37.1 C)     Temp Source 10/20/19 1820 Oral     SpO2 10/20/19 1820 98 %     Weight --      Height --      Head Circumference --      Peak Flow --      Pain Score 10/20/19 1821 2     Pain Loc --      Pain Edu? --      Excl. in [place]? --    No data found.  Updated Vital Signs BP 124/79 (BP Location: Left Arm)   Pulse 93   Temp 98.8 F (37.1 C) (Oral)   Resp 18   LMP 09/25/2019 (Approximate)   SpO2 98%   Visual Acuity Right Eye Distance:   Left Eye Distance:   Bilateral Distance:    Right Eye Near:   Left Eye Near:    Bilateral Near:     Physical Exam Vitals and nursing note reviewed.  Constitutional:      Appearance: She is well-developed.  HENT:     Head: Normocephalic and atraumatic.     Mouth/Throat:     Mouth: Mucous membranes are moist.  Cardiovascular:     Rate and Rhythm: Normal rate and regular rhythm.  Pulmonary:     Effort: Pulmonary effort is normal.     Breath sounds: Normal breath sounds.  Abdominal:     General: There is no distension.     Palpations: Abdomen is soft.     Tenderness: There is no abdominal tenderness. There is no right CVA tenderness or left CVA tenderness.  Musculoskeletal:        General: Normal range of motion.     Cervical back: Normal range of motion.  Skin:    General:  Skin is warm and dry.  Neurological:  Mental Status: She is alert and oriented to person, place, and time.  Psychiatric:        Behavior: Behavior normal.      UC Treatments / Results  Labs (all labs ordered are listed, but only abnormal results are displayed) Labs Reviewed  POCT URINALYSIS DIP (MANUAL ENTRY) - Abnormal; Notable for the following components:      Result Value   Clarity, UA cloudy (*)    Leukocytes, UA Moderate (2+) (*)    All other components within normal limits  URINE CULTURE    EKG   Radiology No results found.  Procedures Procedures (including critical care time)  Medications Ordered in UC Medications - No data to display  Initial Impression / Assessment and Plan / UC Course  I have reviewed the triage vital signs and the nursing notes.  Pertinent labs & imaging results that were available during my care of the patient were reviewed by me and considered in my medical decision making (see chart for details).    Discussed UA Culture sent Will start pt on Keflex for UTI while culture pending AVS provided  Final Clinical Impressions(s) / UC Diagnoses   Final diagnoses:  Dysuria  Acute cystitis without hematuria     Discharge Instructions      Please take your antibiotic as prescribed. A urine culture has been sent to check the severity of your urinary infection and to determine if you are on the most appropriate antibiotic. The results should come back within 2-3 days. You will only be notified if a medication change is indicated.  Please follow up with family medicine or urology if not improving within 1 week, sooner if symptoms worsening.      ED Prescriptions    Medication Sig Dispense Auth. Provider   cephALEXin (KEFLEX) 500 MG capsule Take 1 capsule (500 mg total) by mouth 2 (two) times daily. 14 capsule Noe Gens, [place]     PDMP not reviewed this encounter.   Noe Gens, [place] 10/21/19 1220

## 2019-10-20 NOTE — Discharge Instructions (Signed)
  Please take your antibiotic as prescribed. A urine culture has been sent to check the severity of your urinary infection and to determine if you are on the most appropriate antibiotic. The results should come back within 2-3 days. You will only be notified if a medication change is indicated.  Please follow up with family medicine or urology if not improving within 1 week, sooner if symptoms worsening.   

## 2019-10-21 LAB — URINE CULTURE
MICRO NUMBER:: 10479374
SPECIMEN QUALITY:: ADEQUATE

## 2020-03-19 ENCOUNTER — Other Ambulatory Visit: Payer: Self-pay

## 2020-03-19 ENCOUNTER — Emergency Department (INDEPENDENT_AMBULATORY_CARE_PROVIDER_SITE_OTHER): Payer: BC Managed Care – PPO

## 2020-03-19 ENCOUNTER — Encounter: Payer: Self-pay | Admitting: Emergency Medicine

## 2020-03-19 ENCOUNTER — Emergency Department (INDEPENDENT_AMBULATORY_CARE_PROVIDER_SITE_OTHER)
Admission: EM | Admit: 2020-03-19 | Discharge: 2020-03-19 | Disposition: A | Discharge status: Home / Self Care | Payer: BC Managed Care – PPO | Source: Home / Self Care | Attending: Family Medicine | Admitting: Family Medicine

## 2020-03-19 DIAGNOSIS — R1030 Lower abdominal pain, unspecified: Secondary | ICD-10-CM | POA: Diagnosis not present

## 2020-03-19 DIAGNOSIS — Z87442 Personal history of urinary calculi: Secondary | ICD-10-CM

## 2020-03-19 DIAGNOSIS — N2889 Other specified disorders of kidney and ureter: Secondary | ICD-10-CM | POA: Diagnosis not present

## 2020-03-19 DIAGNOSIS — M5459 Other low back pain: Secondary | ICD-10-CM

## 2020-03-19 DIAGNOSIS — N390 Urinary tract infection, site not specified: Secondary | ICD-10-CM

## 2020-03-19 DIAGNOSIS — R102 Pelvic and perineal pain: Secondary | ICD-10-CM

## 2020-03-19 DIAGNOSIS — Z9851 Tubal ligation status: Secondary | ICD-10-CM | POA: Diagnosis not present

## 2020-03-19 DIAGNOSIS — Z9049 Acquired absence of other specified parts of digestive tract: Secondary | ICD-10-CM | POA: Diagnosis not present

## 2020-03-19 LAB — POCT URINALYSIS DIP (MANUAL ENTRY)
Bilirubin, UA: NEGATIVE
Blood, UA: NEGATIVE
Glucose, UA: NEGATIVE mg/dL
Ketones, POC UA: NEGATIVE mg/dL
Nitrite, UA: NEGATIVE
Protein Ur, POC: NEGATIVE mg/dL
Spec Grav, UA: >=1.03 — AB (ref 1.010–1.025)
Urobilinogen, UA: 0.2 E.U./dL
pH, UA: 5.5 (ref 5.0–8.0)

## 2020-03-19 NOTE — Discharge Instructions (Addendum)
May take Ibuprofen 200mg , 4 tabs every 8 hours with food.   If symptoms become significantly worse during the night or over the weekend, proceed to the local emergency room.

## 2020-03-19 NOTE — ED Provider Notes (Signed)
[facility]    CSN: [id number] Arrival date & time: 03/19/20  1122      History   Chief Complaint Chief Complaint  Patient presents with  . Abdominal Pain  . Back Pain    HPI Sarah Simmons is a 39 y.o. female.   Patient states that she developed dysuria and lower abdominal pain 3 weeks ago, and was treated for a UTI via a telemedicine visit.  She states that she did not finish her cephalexin because her symptoms improved.  She complains of onset of lower abdominal pressure and lower back pain one week ago, and has been constipated for about 1.5 weeks.  She has had mild nausea without vomiting.  She denies vaginal discharge and denies possibility of pregnancy.  She denies fevers, chills, and sweats. She states that her period is due. She states that she has a history of kidney stone.  She had her first Moderna vaccine four days ago.  The history is provided by the patient.    Past Medical History:  Diagnosis Date  . Acoustic neuroma ([place])   . Anemia   . Angio-edema   . Nephrolithiasis   . Papanicolaou smear of cervix with low risk human papillomavirus (HPV) DNA test positive 12/01/2017   Repeat Pap June 2020  . Urticaria     Patient Active Problem List   Diagnosis Date Noted  . Urticaria, acute 09/14/2018  . D-dimer, elevated 09/14/2018  . Acute periumbilical pain 84/16/6063  . Papanicolaou smear of cervix with low risk human papillomavirus (HPV) DNA test positive 12/01/2017  . Class 1 obesity due to excess calories without serious comorbidity in adult 11/29/2017  . Umbilical hernia without obstruction and without gangrene 11/29/2017  . Vitamin D deficiency 11/29/2017  . Encounter for post Essure sterilization check 11/29/2017  . Tenderness of right axilla 11/20/2016  . Axillary tenderness, left 11/20/2016  . Fatigue 11/20/2016  . Unintentional weight loss 11/20/2016  . Deaf, right 01/14/2016  . Right-sided vestibular weakness 01/14/2016  . Acoustic  neuroma syndrome ([place]) 04/09/2015  . Chest wall pain 12/04/2014  . 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 Hair loss 03/20/2014  . Right facial numbness 03/20/2014  . Right-sided sensorineural hearing loss 03/20/2014    Past Surgical History:  Procedure Laterality Date  . ACOUSTIC NEUROMA RESECTION  12/14/2012   neuroma removal  . CHOLECYSTECTOMY  2010  . ESSURE TUBAL LIGATION    . EYE SURGERY     x3  . TUBAL LIGATION      OB History    Gravida  3   Para  3   Term      Preterm      AB      Living        SAB      TAB      Ectopic      Multiple      Live Births               Home Medications    Prior to Admission medications   Medication Sig Start Date End Date Taking? Authorizing Provider  Multiple Vitamin (MULTIVITAMIN) tablet Take 1 tablet by mouth daily.   Yes [provider]  VITAMIN D PO Take 4,000 Units by mouth daily.   Yes [provider]  cephALEXin (KEFLEX) 500 MG capsule Take 1 capsule (500 mg total) by mouth 2 (two) times daily. 10/20/19   Noe Gens, PA-C  cetirizine (ZYRTEC)  10 MG tablet TAKE 1 TABLET BY MOUTH EVERY DAY 04/24/19   Hali Marry, MD  diphenhydrAMINE (BENADRYL) 25 MG tablet Take 1 tablet (25 mg total) by mouth every 6 (six) hours. 09/12/18   Dorie Rank, MD  EPINEPHrine (EPIPEN 2-PAK) 0.3 mg/0.3 mL IJ SOAJ injection Inject 0.3 mLs (0.3 mg total) into the muscle as needed for anaphylaxis. Patient not taking: Reported on 04/19/2019 09/12/18   Dorie Rank, MD  loratadine-pseudoephedrine (CLARITIN-D 12-HOUR) 5-120 MG tablet Take 1 tablet by mouth daily as needed for allergies.    [provider]    Family History Family History  Problem Relation Age of Onset  . Uterine cancer Sister   . Allergic rhinitis Sister   . Liver cancer Father   . Cancer Father   . Allergic rhinitis Father   . Allergic rhinitis Mother   . Transient ischemic attack Other        grandparents   . Hypothyroidism Maternal Grandmother   . Transient ischemic attack Maternal Grandfather   . Transient ischemic attack Paternal Grandfather     Social History Social History   Tobacco Use  . Smoking status: Never Smoker  . Smokeless tobacco: Never Used  Vaping Use  . Vaping Use: Never used  Substance Use Topics  . Alcohol use: Yes    Alcohol/week: 0.0 standard drinks    Comment: rarely  . Drug use: No     Allergies   Almond oil and Chocolate   Review of Systems Review of Systems  Constitutional: Positive for appetite change. Negative for activity change, chills, diaphoresis, fatigue, fever and unexpected weight change.  HENT: Negative.   Eyes: Negative.   Respiratory: Negative.   Cardiovascular: Negative.   Gastrointestinal: Positive for abdominal pain, constipation and nausea. Negative for abdominal distention, blood in stool, diarrhea, rectal pain and vomiting.  Genitourinary: Positive for dysuria. Negative for flank pain, frequency, genital sores, hematuria, pelvic pain, urgency, vaginal bleeding, vaginal discharge and vaginal pain.  Skin: Negative.   Hematological: Negative for adenopathy.     Physical Exam Triage Vital Signs ED Triage Vitals  Enc Vitals Group     BP 03/19/20 1133 (!) 188/82     Pulse Rate 03/19/20 1133 85     Resp 03/19/20 1133 17     Temp 03/19/20 1133 98.6 F (37 C)     Temp Source 03/19/20 1133 Oral     SpO2 03/19/20 1133 98 %     Weight 03/19/20 1140 218 lb (98.9 kg)     Height 03/19/20 1140 5' 8.5" (1.74 m)     Head Circumference --      Peak Flow --      Pain Score 03/19/20 1139 4     Pain Loc --      Pain Edu? --      Excl. in [place]? --    No data found.  Updated Vital Signs BP (!) 188/82 (BP Location: Right Arm)   Pulse 85   Temp 98.6 F (37 C) (Oral)   Resp 17   Ht 5' 8.5" (1.74 m)   Wt 98.9 kg   LMP 02/20/2020 (Approximate)   SpO2 98%   BMI 32.66 kg/m   Visual Acuity Right Eye Distance:   Left Eye Distance:    Bilateral Distance:    Right Eye Near:   Left Eye Near:    Bilateral Near:     Physical Exam Vitals and nursing note reviewed.  Constitutional:  General: She is not in acute distress.    Appearance: She is obese.  HENT:     Head: Normocephalic.     Right Ear: External ear normal.     Left Ear: External ear normal.     Nose: Nose normal.     Mouth/Throat:     Pharynx: Oropharynx is clear.  Eyes:     Conjunctiva/sclera: Conjunctivae normal.     Pupils: Pupils are equal, round, and reactive to light.  Cardiovascular:     Rate and Rhythm: Normal rate and regular rhythm.     Heart sounds: Normal heart sounds.  Pulmonary:     Breath sounds: Normal breath sounds.  Abdominal:     General: Bowel sounds are normal. There is no distension.     Palpations: Abdomen is soft. There is no hepatomegaly, splenomegaly or mass.     Tenderness: There is abdominal tenderness in the suprapubic area. There is no right CVA tenderness, left CVA tenderness or guarding. Negative signs include Murphy's sign.  Musculoskeletal:     Cervical back: Neck supple.     Right lower leg: No edema.     Left lower leg: No edema.  Lymphadenopathy:     Cervical: No cervical adenopathy.  Skin:    General: Skin is warm and dry.     Findings: No rash.          Comments: Mild suprapubic tenderness to palpation and tenderness over symphysis pubis  Neurological:     Mental Status: She is alert and oriented to person, place, and time.      UC Treatments / Results  Labs (all labs ordered are listed, but only abnormal results are displayed) Labs Reviewed  POCT URINALYSIS DIP (MANUAL ENTRY) - Abnormal; Notable for the following components:      Result Value   Color, UA other (*)    Clarity, UA cloudy (*)    Spec Grav, UA >=1.030 (*)    Leukocytes, UA Trace (*)    All other components within normal limits  URINE CULTURE    EKG   Radiology DG Abd 2 Views  Result Date: 03/19/2020 CLINICAL DATA:   UTI EXAM: ABDOMEN - 2 VIEW COMPARISON:  12/24/2017 FINDINGS: The bowel gas pattern is normal. There is no evidence of free air. There are 2 small punctate 2 mm calcifications projecting over the mid left renal shadow. Cholecystectomy clips. Bilateral tubal ligation devices. IMPRESSION: 1. Nonobstructive bowel gas pattern. 2. Two small punctate 2 mm calcifications projecting over the mid left renal shadow, possibly nephrolithiasis. Electronically Signed   By: Davina Poke D.O.   On: 03/19/2020 13:14   CT Renal Stone Study  Result Date: 03/19/2020 CLINICAL DATA:  Pt was treated for a UTI recently, Presents today w/ ongoing abdominal pressure AND lower mid back pain x 1 week - worse last night Hx of UTI, kidney stone EXAM: CT ABDOMEN AND PELVIS WITHOUT CONTRAST TECHNIQUE: Multidetector CT imaging of the abdomen and pelvis was performed following the standard protocol without IV contrast. COMPARISON:  CT abdomen pelvis 12/23/2017 FINDINGS: Lower chest: No acute abnormality. Evaluation of the abdominal viscera somewhat limited by the lack of IV contrast. Hepatobiliary: No focal liver abnormality is seen. Status post cholecystectomy. No biliary dilatation. Pancreas: Unremarkable. Spleen: Normal in size. Adrenals/Urinary Tract: Adrenal glands are unremarkable. Kidneys are symmetric. No hydronephrosis. No renal calculi. No large renal mass. Urinary bladder is unremarkable. Stomach/Bowel: Stomach is within normal limits. Appendix appears normal. No evidence of bowel wall  thickening, distention, or inflammatory changes. Vascular/Lymphatic: No lymphadenopathy. Reproductive: Uterus and bilateral adnexa are unremarkable. Other: No abdominal wall hernia or abnormality. No abdominopelvic ascites. Musculoskeletal: No acute or significant osseous findings. IMPRESSION: No acute intra-abdominal or intrapelvic pathology on a noncontrast exam. Electronically Signed   By: Audie Pinto M.D.   On: 03/19/2020 14:05     Procedures Procedures (including critical care time)  Medications Ordered in UC Medications - No data to display  Initial Impression / Assessment and Plan / UC Course  I have reviewed the triage vital signs and the nursing notes.  Pertinent labs & imaging results that were available during my care of the patient were reviewed by me and considered in my medical decision making (see chart for details).    Etiology of pain unclear.  ?ovarian cyst rupture.  ?pubic osteitis.  Urine culture pending. Followup with gynecologist if not improved in one week.   Final Clinical Impressions(s) / UC Diagnoses   Final diagnoses:  Lower abdominal pain  Suprapubic abdominal pain     Discharge Instructions     May take Ibuprofen 200mg , 4 tabs every 8 hours with food.   If symptoms become significantly worse during the night or over the weekend, proceed to the local emergency room.     ED Prescriptions    None        Kandra Nicolas, MD 03/22/20 1456

## 2020-03-19 NOTE — ED Triage Notes (Signed)
Pt was treated for a UTI recently - no actual culture was done - seen as a telemedicine- no U/A or culture done per pt  Presents today w/ ongoing abdominal pressure & lower mid back pain x 1 week - worse last night Hx of UTI, kidney stone  Decreased BM - feels constipated  Denies pregnancy  Moderna vaccine - 1st dose this past Friday

## 2020-03-20 LAB — URINE CULTURE
MICRO NUMBER:: 11061192
SPECIMEN QUALITY:: ADEQUATE

## 2020-04-22 ENCOUNTER — Other Ambulatory Visit: Payer: Self-pay | Admitting: Family Medicine

## 2020-04-22 DIAGNOSIS — T7840XD Allergy, unspecified, subsequent encounter: Secondary | ICD-10-CM

## 2020-04-22 DIAGNOSIS — L508 Other urticaria: Secondary | ICD-10-CM

## 2020-06-22 ENCOUNTER — Other Ambulatory Visit: Payer: Self-pay | Admitting: Family Medicine

## 2020-06-22 DIAGNOSIS — T7840XD Allergy, unspecified, subsequent encounter: Secondary | ICD-10-CM

## 2020-06-22 DIAGNOSIS — L508 Other urticaria: Secondary | ICD-10-CM

## 2020-11-07 ENCOUNTER — Emergency Department (INDEPENDENT_AMBULATORY_CARE_PROVIDER_SITE_OTHER)
Admission: EM | Admit: 2020-11-07 | Discharge: 2020-11-07 | Disposition: A | Discharge status: Home / Self Care | Payer: BC Managed Care – PPO | Source: Home / Self Care

## 2020-11-07 ENCOUNTER — Other Ambulatory Visit: Payer: Self-pay

## 2020-11-07 DIAGNOSIS — R300 Dysuria: Secondary | ICD-10-CM

## 2020-11-07 DIAGNOSIS — R350 Frequency of micturition: Secondary | ICD-10-CM | POA: Diagnosis not present

## 2020-11-07 LAB — POCT URINALYSIS DIP (MANUAL ENTRY)
Bilirubin, UA: NEGATIVE
Blood, UA: NEGATIVE
Glucose, UA: NEGATIVE mg/dL
Ketones, POC UA: NEGATIVE mg/dL
Nitrite, UA: NEGATIVE
Protein Ur, POC: NEGATIVE mg/dL
Spec Grav, UA: >=1.03 — AB (ref 1.010–1.025)
Urobilinogen, UA: 0.2 E.U./dL
pH, UA: 6 (ref 5.0–8.0)

## 2020-11-07 MED ORDER — PHENAZOPYRIDINE HCL 200 MG PO TABS: 200.0000 mg | ORAL_TABLET | Freq: Three times a day (TID) | ORAL | 0 refills | Status: AC

## 2020-11-07 NOTE — ED Provider Notes (Signed)
[facility]    CSN: [id number] Arrival date & time: 11/07/20  1528      History   Chief Complaint Chief Complaint  Patient presents with  . Dysuria  . Urinary Frequency    HPI Sarah Simmons is a 40 y.o. female.   HPI 40 year old female presents with dysuria and urinary frequency for 4 days reports mild lower back pain during this time as well.  PMH significant for nephrolithiasis.  Past Medical History:  Diagnosis Date  . Acoustic neuroma ([place])   . Anemia   . Angio-edema   . Nephrolithiasis   . Papanicolaou smear of cervix with low risk human papillomavirus (HPV) DNA test positive 12/01/2017   Repeat Pap June 2020  . Urticaria     Patient Active Problem List   Diagnosis Date Noted  . Urticaria, acute 09/14/2018  . D-dimer, elevated 09/14/2018  . Acute periumbilical pain 64/33/2951  . Papanicolaou smear of cervix with low risk human papillomavirus (HPV) DNA test positive 12/01/2017  . Class 1 obesity due to excess calories without serious comorbidity in adult 11/29/2017  . Umbilical hernia without obstruction and without gangrene 11/29/2017  . Vitamin D deficiency 11/29/2017  . Encounter for post Essure sterilization check 11/29/2017  . Tenderness of right axilla 11/20/2016  . Axillary tenderness, left 11/20/2016  . Fatigue 11/20/2016  . Unintentional weight loss 11/20/2016  . Deaf, right 01/14/2016  . Right-sided vestibular weakness 01/14/2016  . 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 Chest wall pain 12/04/2014  . 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 Hair loss 03/20/2014  . Right facial numbness 03/20/2014  . Right-sided sensorineural hearing loss 03/20/2014    Past Surgical History:  Procedure Laterality Date  . ACOUSTIC NEUROMA RESECTION  12/14/2012   neuroma removal  . CHOLECYSTECTOMY  2010  . ESSURE TUBAL LIGATION    . EYE SURGERY     x3  . TUBAL LIGATION      OB History    Gravida  3   Para  3    Term      Preterm      AB      Living        SAB      IAB      Ectopic      Multiple      Live Births               Home Medications    Prior to Admission medications   Medication Sig Start Date End Date Taking? Authorizing Provider  phenazopyridine (PYRIDIUM) 200 MG tablet Take 1 tablet (200 mg total) by mouth 3 (three) times daily for 5 days. 11/07/20 11/12/20 Yes Eliezer Lofts, FNP  cetirizine (ZYRTEC) 10 MG tablet TAKE 1 TABLET BY MOUTH EVERY DAY 06/24/20   Samuel Bouche, NP  diphenhydrAMINE (BENADRYL) 25 MG tablet Take 1 tablet (25 mg total) by mouth every 6 (six) hours. 09/12/18   Dorie Rank, MD  EPINEPHrine (EPIPEN 2-PAK) 0.3 mg/0.3 mL IJ SOAJ injection Inject 0.3 mLs (0.3 mg total) into the muscle as needed for anaphylaxis. Patient not taking: Reported on 04/19/2019 09/12/18   Dorie Rank, MD  loratadine-pseudoephedrine (CLARITIN-D 12-HOUR) 5-120 MG tablet Take 1 tablet by mouth daily as needed for allergies.    [provider]  Multiple Vitamin (MULTIVITAMIN) tablet Take 1 tablet by mouth daily.    [provider]  VITAMIN D PO Take 4,000 Units by mouth daily.  [provider]    Family History Family History  Problem Relation Age of Onset  . Uterine cancer Sister   . Allergic rhinitis Sister   . Liver cancer Father   . Cancer Father   . Allergic rhinitis Father   . Allergic rhinitis Mother   . Transient ischemic attack Other        grandparents  . Hypothyroidism Maternal Grandmother   . Transient ischemic attack Maternal Grandfather   . Transient ischemic attack Paternal Grandfather     Social History Social History   Tobacco Use  . Smoking status: Never Smoker  . Smokeless tobacco: Never Used  Vaping Use  . Vaping Use: Never used  Substance Use Topics  . Alcohol use: Yes    Alcohol/week: 0.0 standard drinks    Comment: rarely  . Drug use: No     Allergies   Almond oil, Chocolate, and Azithromycin   Review of  Systems Review of Systems  Constitutional: Negative.   HENT: Negative.   Eyes: Negative.   Respiratory: Negative.   Cardiovascular: Negative.   Gastrointestinal: Negative.   Genitourinary: Positive for dysuria and urgency.  Musculoskeletal: Negative.   Skin: Negative.   Neurological: Negative.      Physical Exam Triage Vital Signs ED Triage Vitals  Enc Vitals Group     BP 11/07/20 1545 128/83     Pulse Rate 11/07/20 1545 84     Resp 11/07/20 1545 17     Temp 11/07/20 1545 98.2 F (36.8 C)     Temp Source 11/07/20 1545 Oral     SpO2 11/07/20 1545 98 %     Weight --      Height --      Head Circumference --      Peak Flow --      Pain Score 11/07/20 1547 2     Pain Loc --      Pain Edu? --      Excl. in [place]? --    No data found.  Updated Vital Signs BP 128/83 (BP Location: Right Arm)   Pulse 84   Temp 98.2 F (36.8 C) (Oral)   Resp 17   LMP 10/16/2020   SpO2 98%      Physical Exam Vitals and nursing note reviewed.  Constitutional:      General: She is not in acute distress.    Appearance: Normal appearance. She is obese. She is not ill-appearing.  HENT:     Head: Normocephalic and atraumatic.     Nose: Nose normal.     Mouth/Throat:     Mouth: Mucous membranes are moist.     Pharynx: Oropharynx is clear.  Eyes:     Conjunctiva/sclera: Conjunctivae normal.     Pupils: Pupils are equal, round, and reactive to light.  Cardiovascular:     Rate and Rhythm: Normal rate and regular rhythm.     Pulses: Normal pulses.     Heart sounds: Normal heart sounds.  Pulmonary:     Effort: Pulmonary effort is normal. No respiratory distress.     Breath sounds: Normal breath sounds. No wheezing, rhonchi or rales.  Abdominal:     Tenderness: There is no right CVA tenderness or left CVA tenderness.  Musculoskeletal:        General: Normal range of motion.     Cervical back: Normal range of motion and neck supple. No tenderness.  Lymphadenopathy:     Cervical: No  cervical adenopathy.  Skin:  General: Skin is warm and dry.  Neurological:     General: No focal deficit present.     Mental Status: She is alert and oriented to person, place, and time.  Psychiatric:        Mood and Affect: Mood normal.        Behavior: Behavior normal.      UC Treatments / Results  Labs (all labs ordered are listed, but only abnormal results are displayed) Labs Reviewed  POCT URINALYSIS DIP (MANUAL ENTRY) - Abnormal; Notable for the following components:      Result Value   Spec Grav, UA >=1.030 (*)    Leukocytes, UA Trace (*)    All other components within normal limits  URINE CULTURE    EKG   Radiology No results found.  Procedures Procedures (including critical care time)  Medications Ordered in UC Medications - No data to display  Initial Impression / Assessment and Plan / UC Course  I have reviewed the triage vital signs and the nursing notes.  Pertinent labs & imaging results that were available during my care of the patient were reviewed by me and considered in my medical decision making (see chart for details).     MDM: 1. Dysuria, 2.  Urinary frequency.  Discharged home, hemodynamically stable. Final Clinical Impressions(s) / UC Diagnoses   Final diagnoses:  Dysuria  Urinary frequency     Discharge Instructions     Advised patient may use Pyridium daily as needed for dysuria.  Encourage patient increase daily water intake (32-48 ounces).  Advised patient we will follow-up with urine culture results once received.    ED Prescriptions    Medication Sig Dispense Auth. Provider   phenazopyridine (PYRIDIUM) 200 MG tablet Take 1 tablet (200 mg total) by mouth 3 (three) times daily for 5 days. 15 tablet Eliezer Lofts, FNP     PDMP not reviewed this encounter.   Eliezer Lofts, [place] 11/07/20 [id number]

## 2020-11-07 NOTE — ED Triage Notes (Signed)
Pt c/o urinary frequency and dysuria since Monday. Some lower back pain. At home UTI test pos. AZO for discomfort prn.

## 2020-11-07 NOTE — Discharge Instructions (Addendum)
Advised patient may use Pyridium daily as needed for dysuria.  Encourage patient increase daily water intake (32-48 ounces).  Advised patient we will follow-up with urine culture results once received.

## 2020-11-09 LAB — URINE CULTURE
MICRO NUMBER:: 11963507
SPECIMEN QUALITY:: ADEQUATE

## 2021-02-20 DIAGNOSIS — Z20822 Contact with and (suspected) exposure to covid-19: Secondary | ICD-10-CM | POA: Diagnosis not present

## 2021-06-18 ENCOUNTER — Other Ambulatory Visit: Payer: Self-pay | Admitting: Student

## 2021-06-18 DIAGNOSIS — Z1231 Encounter for screening mammogram for malignant neoplasm of breast: Secondary | ICD-10-CM

## 2021-07-04 ENCOUNTER — Ambulatory Visit
Admission: RE | Admit: 2021-07-04 | Discharge: 2021-07-04 | Disposition: A | Discharge status: Home / Self Care | Payer: BC Managed Care – PPO | Source: Ambulatory Visit | Attending: Student | Admitting: Student

## 2021-07-04 DIAGNOSIS — Z1231 Encounter for screening mammogram for malignant neoplasm of breast: Secondary | ICD-10-CM | POA: Diagnosis not present

## 2021-08-08 ENCOUNTER — Ambulatory Visit (INDEPENDENT_AMBULATORY_CARE_PROVIDER_SITE_OTHER): Payer: BC Managed Care – PPO | Admitting: Medical-Surgical

## 2021-08-08 ENCOUNTER — Other Ambulatory Visit: Payer: Self-pay

## 2021-08-08 ENCOUNTER — Encounter: Payer: Self-pay | Admitting: Medical-Surgical

## 2021-08-08 VITALS — BP 116/80 | HR 86 | Wt 225.0 lb

## 2021-08-08 DIAGNOSIS — Z131 Encounter for screening for diabetes mellitus: Secondary | ICD-10-CM

## 2021-08-08 DIAGNOSIS — Z7689 Persons encountering health services in other specified circumstances: Secondary | ICD-10-CM | POA: Diagnosis not present

## 2021-08-08 DIAGNOSIS — H6123 Impacted cerumen, bilateral: Secondary | ICD-10-CM

## 2021-08-08 DIAGNOSIS — Z0000 Encounter for general adult medical examination without abnormal findings: Secondary | ICD-10-CM | POA: Diagnosis not present

## 2021-08-08 DIAGNOSIS — D333 Benign neoplasm of cranial nerves: Secondary | ICD-10-CM

## 2021-08-08 DIAGNOSIS — T782XXD Anaphylactic shock, unspecified, subsequent encounter: Secondary | ICD-10-CM

## 2021-08-08 DIAGNOSIS — L600 Ingrowing nail: Secondary | ICD-10-CM

## 2021-08-08 DIAGNOSIS — Z1329 Encounter for screening for other suspected endocrine disorder: Secondary | ICD-10-CM | POA: Diagnosis not present

## 2021-08-08 MED ORDER — EPINEPHRINE 0.3 MG/0.3ML IJ SOAJ: 0.3000 mg | INTRAMUSCULAR | 1 refills | Status: DC | PRN

## 2021-08-08 NOTE — Patient Instructions (Signed)

## 2021-08-08 NOTE — Progress Notes (Signed)
HPI: Sarah Simmons is a 41 y.o. female who  has a past medical history of Acoustic neuroma ([place]), Anemia, Angio-edema, Nephrolithiasis, Papanicolaou smear of cervix with low risk human papillomavirus (HPV) DNA test positive (12/01/2017), and Urticaria.  she presents to [facility] today, 08/08/21,  for chief complaint of: Annual physical exam  Dentist: UTD Eye exam: UTD Exercise: None intentional Diet: No restrictions aside from almonds Pap smear: OB/GYN appointment upcoming Mammogram: OB/GYN appointment upcoming Colon cancer screening: Not indicated Prostate cancer screening: Not applicable COVID vaccine: Done, UTD  Concerns: Due for brain scan for follow-up on acoustic neuroma  Past medical, surgical, social and family history reviewed:  Patient Active Problem List   Diagnosis Date Noted   Urticaria, acute 09/14/2018   D-dimer, elevated 09/14/2018   Acute periumbilical pain 16/83/7290   Papanicolaou smear of cervix with low risk human papillomavirus (HPV) DNA test positive 12/01/2017   Class 1 obesity due to excess calories without serious comorbidity in adult 21/04/5519   Umbilical hernia without obstruction and without gangrene 11/29/2017   Vitamin D deficiency 11/29/2017   Encounter for post Essure sterilization check 11/29/2017   Tenderness of right axilla 11/20/2016   Axillary tenderness, left 11/20/2016   Fatigue 11/20/2016   Unintentional weight loss 11/20/2016   Deaf, right 01/14/2016   Right-sided vestibular weakness 01/14/2016   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   Chest wall pain 12/04/2014   Extensor hallucis longus tendinitis of right foot 11/13/2014   Hair loss 03/20/2014   Right facial numbness 03/20/2014   Right-sided sensorineural hearing loss 03/20/2014    Past Surgical History:  Procedure Laterality Date   ACOUSTIC NEUROMA RESECTION  12/14/2012   neuroma removal   CHOLECYSTECTOMY  2010   ESSURE TUBAL  LIGATION     EYE SURGERY     x3   TUBAL LIGATION      Social History   Tobacco Use   Smoking status: Never   Smokeless tobacco: Never  Substance Use Topics   Alcohol use: Yes    Alcohol/week: 0.0 standard drinks    Comment: rarely    Family History  Problem Relation Age of Onset   Uterine cancer Sister    Allergic rhinitis Sister    Liver cancer Father    Cancer Father    Allergic rhinitis Father    Allergic rhinitis Mother    Transient ischemic attack Other        grandparents   Hypothyroidism Maternal Grandmother    Transient ischemic attack Maternal Grandfather    Transient ischemic attack Paternal Grandfather      Current medication list and allergy/intolerance information reviewed:    Current Outpatient Medications  Medication Sig Dispense Refill   cetirizine (ZYRTEC) 10 MG tablet TAKE 1 TABLET BY MOUTH EVERY DAY 90 tablet 1   loratadine-pseudoephedrine (CLARITIN-D 12-HOUR) 5-120 MG tablet Take 1 tablet by mouth daily as needed for allergies.     Multiple Vitamin (MULTIVITAMIN) tablet Take 1 tablet by mouth daily.     VITAMIN D PO Take 4,000 Units by mouth daily.     EPINEPHrine (EPIPEN 2-PAK) 0.3 mg/0.3 mL IJ SOAJ injection Inject 0.3 mg into the muscle as needed for anaphylaxis. 1 each 1   No current facility-administered medications for this visit.    Allergies  Allergen Reactions   Almond Oil Itching and Other (See Comments)    Flushing    Chocolate Hives and Itching    If she eats too much  Azithromycin Diarrhea      Review of Systems: Constitutional:  No  fever, no chills, No recent illness, No unintentional weight changes. No significant fatigue.  HEENT: No  headache, no vision change, chronic right ear hearing loss, No sore throat, No  sinus pressure Cardiac: No  chest pain, No  pressure, No palpitations, No  Orthopnea Respiratory:  No  shortness of breath. No  Cough Gastrointestinal: No  abdominal pain, No  nausea, No  vomiting,  No  blood in  stool, No  diarrhea, No  constipation  Musculoskeletal: No new myalgia/arthralgia Skin: No  Rash, No other wounds/concerning lesions Genitourinary: No  incontinence, No  abnormal genital bleeding, No abnormal genital discharge Hem/Onc: No  easy bruising/bleeding, No  abnormal lymph node Endocrine: No cold intolerance,  No heat intolerance. No polyuria/polydipsia/polyphagia  Neurologic: No  weakness, No  dizziness, No  slurred speech/focal weakness/facial droop Psychiatric: No  concerns with depression, No  concerns with anxiety, No sleep problems, No mood problems  Exam:  BP 116/80    Pulse 86    Wt 225 lb (102.1 kg)    SpO2 98%    BMI 33.71 kg/m  Constitutional: VS see above. General Appearance: alert, well-developed, well-nourished, NAD Eyes: Normal lids and conjunctive, non-icteric sclera Ears, Nose, Mouth, Throat: MMM, Normal external inspection ears/nares/mouth/lips/gums. TM normal bilaterally.  Neck: No masses, trachea midline. No thyroid enlargement. No tenderness/mass appreciated. No lymphadenopathy Respiratory: Normal respiratory effort. no wheeze, no rhonchi, no rales Cardiovascular: S1/S2 normal, no murmur, no rub/gallop auscultated. RRR. No lower extremity edema. Pedal pulse II/IV bilaterally  PT. No carotid bruit or JVD. No abdominal aortic bruit. Gastrointestinal: Nontender, no masses. No hepatomegaly, no splenomegaly.  Small umbilical hernia appreciated. Bowel sounds normal. Rectal exam deferred.  Musculoskeletal: Gait normal. No clubbing/cyanosis of digits.  Neurological: Normal balance/coordination. No tremor. No cranial nerve deficit on limited exam. Motor and sensation intact and symmetric. Cerebellar reflexes intact.  Skin: warm, dry, intact. No rash/ulcer. No concerning nevi or subq nodules on limited exam.   Psychiatric: Normal judgment/insight. Normal mood and affect. Oriented x3.    ASSESSMENT/PLAN:   1. Encounter to establish care Reviewed available information  and discussed care concerns with patient.   2. Annual physical exam Checking labs as below.  Up-to-date on preventative care.  Wellness information provided with AVS - CBC with Differential/Platelet - COMPLETE METABOLIC PANEL WITH GFR - Lipid Panel w/reflex Direct LDL  3. Thyroid disorder screen Checking TSH. - TSH  4. Diabetes mellitus screening Checking hemoglobin A1c. - Hemoglobin A1c  5. Bilateral impacted cerumen Indication: Cerumen impaction of the ear(s) Medical necessity statement: On physical examination, cerumen impairs clinically significant portions of the external auditory canal, and tympanic membrane. Noted obstructive, copious cerumen that cannot be removed without magnification and instrumentations. Consent: Discussed benefits and risks of procedure and verbal consent obtained Procedure: Patient was prepped for the procedure. Utilized an otoscope to assess and take note of the ear canal, the tympanic membrane, and the presence, amount, and placement of the cerumen. Gentle water irrigation and soft plastic curette was utilized to remove cerumen.  Post procedure examination: shows cerumen was completely removed. Patient tolerated procedure well. The patient is made aware that they may experience temporary vertigo, temporary hearing loss, and temporary discomfort. If these symptom last for more than 24 hours to call the clinic or proceed to the ED.  6. 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 -  MR Brain W Wo Contrast; Future  7. Anaphylaxis, subsequent encounter Refilling EpiPen.  8.  Ingrown toenail Referring to podiatry per patient request.  Orders Placed This Encounter  Procedures   MR Brain W Wo Contrast   CBC with Differential/Platelet   COMPLETE METABOLIC PANEL WITH GFR   Lipid Panel w/reflex Direct LDL   TSH   Hemoglobin A1c   Ambulatory referral to Podiatry    Meds  ordered this encounter  Medications   EPINEPHrine (EPIPEN 2-PAK) 0.3 mg/0.3 mL IJ SOAJ injection    Sig: Inject 0.3 mg into the muscle as needed for anaphylaxis.    Dispense:  1 each    Refill:  1    Patient Instructions  Preventive Care 48-49 Years Old, Female Preventive care refers to lifestyle choices and visits with your health care provider that can promote health and wellness. Preventive care visits are also called wellness exams. What can I expect for my preventive care visit? Counseling Your health care provider may ask you questions about your: Medical history, including: Past medical problems. Family medical history. Pregnancy history. Current health, including: Menstrual cycle. Method of birth control. Emotional well-being. Home life and relationship well-being. Sexual activity and sexual health. Lifestyle, including: Alcohol, nicotine or tobacco, and drug use. Access to firearms. Diet, exercise, and sleep habits. Work and work Statistician. Sunscreen use. Safety issues such as seatbelt and bike helmet use. Physical exam Your health care provider will check your: Height and weight. These may be used to calculate your BMI (body mass index). BMI is a measurement that tells if you are at a healthy weight. Waist circumference. This measures the distance around your waistline. This measurement also tells if you are at a healthy weight and may help predict your risk of certain diseases, such as type 2 diabetes and high blood pressure. Heart rate and blood pressure. Body temperature. Skin for abnormal spots. What immunizations do I need? Vaccines are usually given at various ages, according to a schedule. Your health care provider will recommend vaccines for you based on your age, medical history, and lifestyle or other factors, such as travel or where you work. What tests do I need? Screening Your health care provider may recommend screening tests for certain conditions.  This may include: Lipid and cholesterol levels. Diabetes screening. This is done by checking your blood sugar (glucose) after you have not eaten for a while (fasting). Pelvic exam and Pap test. Hepatitis B test. Hepatitis C test. HIV (human immunodeficiency virus) test. STI (sexually transmitted infection) testing, if you are at risk. Lung cancer screening. Colorectal cancer screening. Mammogram. Talk with your health care provider about when you should start having regular mammograms. This may depend on whether you have a family history of breast cancer. BRCA-related cancer screening. This may be done if you have a family history of breast, ovarian, tubal, or peritoneal cancers. Bone density scan. This is done to screen for osteoporosis. Talk with your health care provider about your test results, treatment options, and if necessary, the need for more tests. Follow these instructions at home: Eating and drinking  Eat a diet that includes fresh fruits and vegetables, whole grains, lean protein, and low-fat dairy products. Take vitamin and mineral supplements as recommended by your health care provider. Do not drink alcohol if: Your health care provider tells you not to drink. You are pregnant, may be pregnant, or are planning to become pregnant. If you drink alcohol: Limit how much you have to 0-1 drink  a day. Know how much alcohol is in your drink. In the [place] one drink equals one 12 oz bottle of beer (355 mL), one 5 oz glass of wine (148 mL), or one 1 oz glass of hard liquor (44 mL). Lifestyle Brush your teeth every morning and night with fluoride toothpaste. Floss one time each day. Exercise for at least 30 minutes 5 or more days each week. Do not use any products that contain nicotine or tobacco. These products include cigarettes, chewing tobacco, and vaping devices, such as e-cigarettes. If you need help quitting, ask your health care provider. Do not use drugs. If you are  sexually active, practice safe sex. Use a condom or other form of protection to prevent STIs. If you do not wish to become pregnant, use a form of birth control. If you plan to become pregnant, see your health care provider for a prepregnancy visit. Take aspirin only as told by your health care provider. Make sure that you understand how much to take and what form to take. Work with your health care provider to find out whether it is safe and beneficial for you to take aspirin daily. Find healthy ways to manage stress, such as: Meditation, yoga, or listening to music. Journaling. Talking to a trusted person. Spending time with friends and family. Minimize exposure to UV radiation to reduce your risk of skin cancer. Safety Always wear your seat belt while driving or riding in a vehicle. Do not drive: If you have been drinking alcohol. Do not ride with someone who has been drinking. When you are tired or distracted. While texting. If you have been using any mind-altering substances or drugs. Wear a helmet and other protective equipment during sports activities. If you have firearms in your house, make sure you follow all gun safety procedures. Seek help if you have been physically or sexually abused. What's next? Visit your health care provider once a year for an annual wellness visit. Ask your health care provider how often you should have your eyes and teeth checked. Stay up to date on all vaccines. This information is not intended to replace advice given to you by your health care provider. Make sure you discuss any questions you have with your health care provider. Document Revised: 11/20/2020 Document Reviewed: 11/20/2020 Elsevier Patient Education  [place].   Follow-up plan: Return in about 1 year (around 08/09/2022) for annual physical exam or sooner if needed.  Clearnce Sorrel, DNP, APRN, FNP-BC [facility]

## 2021-08-09 LAB — HEMOGLOBIN A1C
Hgb A1c MFr Bld: 5.3 % of total Hgb (ref <5.7)
Mean Plasma Glucose: 105 mg/dL
eAG (mmol/L): 5.8 mmol/L

## 2021-08-09 LAB — COMPLETE METABOLIC PANEL WITH GFR
AG Ratio: 1.3 (calc) (ref 1.0–2.5)
ALT: 35 U/L — ABNORMAL HIGH (ref 6–29)
AST: 20 U/L (ref 10–30)
Albumin: 4 g/dL (ref 3.6–5.1)
Alkaline phosphatase (APISO): 106 U/L (ref 31–125)
BUN: 14 mg/dL (ref 7–25)
CO2: 27 mmol/L (ref 20–32)
Calcium: 9.6 mg/dL (ref 8.6–10.2)
Chloride: 105 mmol/L (ref 98–110)
Creat: 0.88 mg/dL (ref 0.50–0.99)
Globulin: 3.1 g/dL (calc) (ref 1.9–3.7)
Glucose, Bld: 98 mg/dL (ref 65–99)
Potassium: 4.7 mmol/L (ref 3.5–5.3)
Sodium: 141 mmol/L (ref 135–146)
Total Bilirubin: 0.7 mg/dL (ref 0.2–1.2)
Total Protein: 7.1 g/dL (ref 6.1–8.1)
eGFR: 85 mL/min/{1.73_m2} (ref >=60)

## 2021-08-09 LAB — CBC WITH DIFFERENTIAL/PLATELET
Absolute Monocytes: 587 cells/uL (ref 200–950)
Basophils Absolute: 71 cells/uL (ref 0–200)
Basophils Relative: 0.8 %
Eosinophils Absolute: 258 cells/uL (ref 15–500)
Eosinophils Relative: 2.9 %
HCT: 46.3 % — ABNORMAL HIGH (ref 35.0–45.0)
Hemoglobin: 15 g/dL (ref 11.7–15.5)
Lymphs Abs: 2937 cells/uL (ref 850–3900)
MCH: 27 pg (ref 27.0–33.0)
MCHC: 32.4 g/dL (ref 32.0–36.0)
MCV: 83.3 fL (ref 80.0–100.0)
MPV: 11 fL (ref 7.5–12.5)
Monocytes Relative: 6.6 %
Neutro Abs: 5046 cells/uL (ref 1500–7800)
Neutrophils Relative %: 56.7 %
Platelets: 390 10*3/uL (ref 140–400)
RBC: 5.56 10*6/uL — ABNORMAL HIGH (ref 3.80–5.10)
RDW: 12.8 % (ref 11.0–15.0)
Total Lymphocyte: 33 %
WBC: 8.9 10*3/uL (ref 3.8–10.8)

## 2021-08-09 LAB — LIPID PANEL W/REFLEX DIRECT LDL
Cholesterol: 205 mg/dL — ABNORMAL HIGH (ref <200)
HDL: 56 mg/dL (ref >=50)
LDL Cholesterol (Calc): 122 mg/dL (calc) — ABNORMAL HIGH
Non-HDL Cholesterol (Calc): 149 mg/dL (calc) — ABNORMAL HIGH (ref <130)
Total CHOL/HDL Ratio: 3.7 (calc) (ref <5.0)
Triglycerides: 153 mg/dL — ABNORMAL HIGH (ref <150)

## 2021-08-09 LAB — TSH: TSH: 0.76 mIU/L

## 2021-08-13 ENCOUNTER — Encounter: Payer: Self-pay | Admitting: Podiatry

## 2021-08-13 ENCOUNTER — Ambulatory Visit (INDEPENDENT_AMBULATORY_CARE_PROVIDER_SITE_OTHER): Payer: BC Managed Care – PPO | Admitting: Podiatry

## 2021-08-13 ENCOUNTER — Other Ambulatory Visit: Payer: Self-pay

## 2021-08-13 ENCOUNTER — Encounter: Payer: Self-pay | Admitting: Medical-Surgical

## 2021-08-13 DIAGNOSIS — L600 Ingrowing nail: Secondary | ICD-10-CM

## 2021-08-13 MED ORDER — NEOMYCIN-POLYMYXIN-HC 1 % OT SOLN: OTIC | 1 refills | Status: DC

## 2021-08-13 NOTE — Patient Instructions (Signed)

## 2021-08-14 NOTE — Progress Notes (Signed)
?Subjective:  ?Patient ID: Sarah Simmons, female    DOB: 1981/05/29,  MRN: [id number] ?HPI ?Chief Complaint  ?Patient presents with  ? Toe Pain  ?  Hallux right - medial border, ingrown x couple months, no treatment  ? New Patient (Initial Visit)  ? ? ?41 y.o. female presents with the above complaint.  ? ?ROS: Denies fever chills nausea vomiting muscle aches pains calf pain back pain chest pain shortness of breath. ? ?Past Medical History:  ?Diagnosis Date  ? Acoustic neuroma ([place])   ? Anemia   ? Angio-edema   ? Nephrolithiasis   ? Papanicolaou smear of cervix with low risk human papillomavirus (HPV) DNA test positive 12/01/2017  ? Repeat Pap June 2020  ? Urticaria   ? ?Past Surgical History:  ?Procedure Laterality Date  ? ACOUSTIC NEUROMA RESECTION  12/14/2012  ? neuroma removal  ? CHOLECYSTECTOMY  2010  ? ESSURE TUBAL LIGATION    ? EYE SURGERY    ? x3  ? TUBAL LIGATION    ? ? ?Current Outpatient Medications:  ?  NEOMYCIN-POLYMYXIN-HYDROCORTISONE (CORTISPORIN) 1 % SOLN OTIC solution, Apply 1-2 drops to toe BID after soaking, Disp: 10 mL, Rfl: 1 ?  cetirizine (ZYRTEC) 10 MG tablet, TAKE 1 TABLET BY MOUTH EVERY DAY, Disp: 90 tablet, Rfl: 1 ?  EPINEPHrine (EPIPEN 2-PAK) 0.3 mg/0.3 mL IJ SOAJ injection, Inject 0.3 mg into the muscle as needed for anaphylaxis., Disp: 1 each, Rfl: 1 ?  loratadine-pseudoephedrine (CLARITIN-D 12-HOUR) 5-120 MG tablet, Take 1 tablet by mouth daily as needed for allergies., Disp: , Rfl:  ?  Multiple Vitamin (MULTIVITAMIN) tablet, Take 1 tablet by mouth daily., Disp: , Rfl:  ?  VITAMIN D PO, Take 4,000 Units by mouth daily., Disp: , Rfl:  ? ?Allergies  ?Allergen Reactions  ? Almond Oil Itching and Other (See Comments)  ?  Flushing   ? Chocolate Hives and Itching  ?  If she eats too much  ? Azithromycin Diarrhea  ? ?Review of Systems ?Objective:  ?There were no vitals filed for this visit. ? ?General: Well developed, nourished, in no acute distress, alert and oriented x3  ? ?Dermatological: Skin  is warm, dry and supple bilateral. Nails x 10 are well maintained; remaining integument appears unremarkable at this time. There are no open sores, no preulcerative lesions, no rash or signs of infection present.  Sharp incurvated nail margin with mild erythema tibial border of the hallux right.  No purulence no malodor ? ?Vascular: Dorsalis Pedis artery and Posterior Tibial artery pedal pulses are 2/4 bilateral with immedate capillary fill time. Pedal hair growth present. No varicosities and no lower extremity edema present bilateral.  ? ?Neruologic: Grossly intact via light touch bilateral. Vibratory intact via tuning fork bilateral. Protective threshold with Semmes Wienstein monofilament intact to all pedal sites bilateral. Patellar and Achilles deep tendon reflexes 2+ bilateral. No Babinski or clonus noted bilateral.  ? ?Musculoskeletal: No gross boney pedal deformities bilateral. No pain, crepitus, or limitation noted with foot and ankle range of motion bilateral. Muscular strength 5/5 in all groups tested bilateral. ? ?Gait: Unassisted, Nonantalgic.  ? ? ?Radiographs: ? ?None taken ? ?Assessment & Plan:  ? ?Assessment: Ingrown toenail hallux right tibial border ? ?Plan: Chemical matricectomy was performed today after local anesthetic was administered.  She tolerated procedure well without complications.  Received both oral and home-going instruction for the care and soaking of the toe as well as a prescription for Cortisporin Otic to be applied twice  daily with it after soaking.  Like to follow-up with her in 2 weeks just for reevaluation make sure this is healing well. ? ? ? ? ?Meko Masterson T. [place], DPM ?

## 2021-08-23 ENCOUNTER — Emergency Department (INDEPENDENT_AMBULATORY_CARE_PROVIDER_SITE_OTHER)
Admission: EM | Admit: 2021-08-23 | Discharge: 2021-08-23 | Disposition: A | Discharge status: Home / Self Care | Payer: BC Managed Care – PPO | Source: Home / Self Care | Attending: Family Medicine | Admitting: Family Medicine

## 2021-08-23 ENCOUNTER — Other Ambulatory Visit: Payer: Self-pay

## 2021-08-23 DIAGNOSIS — R109 Unspecified abdominal pain: Secondary | ICD-10-CM

## 2021-08-23 DIAGNOSIS — R112 Nausea with vomiting, unspecified: Secondary | ICD-10-CM

## 2021-08-23 DIAGNOSIS — R1084 Generalized abdominal pain: Secondary | ICD-10-CM

## 2021-08-23 LAB — POCT URINALYSIS DIP (MANUAL ENTRY)
Bilirubin, UA: NEGATIVE
Glucose, UA: NEGATIVE mg/dL
Ketones, POC UA: NEGATIVE mg/dL
Nitrite, UA: NEGATIVE
Spec Grav, UA: >=1.03 — AB (ref 1.010–1.025)
Urobilinogen, UA: 0.2 E.U./dL
pH, UA: 6 (ref 5.0–8.0)

## 2021-08-23 MED ORDER — ONDANSETRON HCL 8 MG PO TABS: 8.0000 mg | ORAL_TABLET | Freq: Three times a day (TID) | ORAL | 0 refills | Status: DC | PRN

## 2021-08-23 MED ORDER — POLYETHYLENE GLYCOL 3350 17 G PO PACK: 17.0000 g | PACK | Freq: Every day | ORAL | 0 refills | Status: DC

## 2021-08-23 NOTE — ED Triage Notes (Signed)
Pt states that she has some vomiting, nausea, bloating and constipation. X5 days ? ?Pt states that she has an umbilical hernia. ? ?

## 2021-08-23 NOTE — ED Provider Notes (Signed)
?[facility] ? ? ? ?CSN: [id number] ?Arrival date & time: 08/23/21  1552 ? ? ?  ? ?History   ?Chief Complaint ?Chief Complaint  ?Patient presents with  ? Vomiting  ?  Vomiting, nausea, bloating and constipation. X5 days  ? ? ?HPI ?Sarah Simmons is a 41 y.o. female.  ? ?HPI ? ?Patient is here for abdominal pain.  She has had worsening abdominal pain throughout the week.  She also states she has an umbilical hernia.  Because she has pain in her abdomen she is concerned about "a blockage".  She ate a full meal earlier today without difficulty.  She did vomit once this week.  She states she is having small amount of bowels and it is hard for her to move her bowels, she thinks she may be constipated.  She has not taken any medicine for constipation.  She states she does not normally have irregular bowels or known irritable bowel syndrome ? ?Past Medical History:  ?Diagnosis Date  ? Acoustic neuroma ([place])   ? Anemia   ? Angio-edema   ? Nephrolithiasis   ? Papanicolaou smear of cervix with low risk human papillomavirus (HPV) DNA test positive 12/01/2017  ? Repeat Pap June 2020  ? Urticaria   ? ? ?Patient Active Problem List  ? Diagnosis Date Noted  ? Urticaria, acute 09/14/2018  ? D-dimer, elevated 09/14/2018  ? Acute periumbilical pain 16/96/7893  ? Papanicolaou smear of cervix with low risk human papillomavirus (HPV) DNA test positive 12/01/2017  ? Class 1 obesity due to excess calories without serious comorbidity in adult 11/29/2017  ? Umbilical hernia without obstruction and without gangrene 11/29/2017  ? Vitamin D deficiency 11/29/2017  ? Encounter for post Essure sterilization check 11/29/2017  ? Tenderness of right axilla 11/20/2016  ? Axillary tenderness, left 11/20/2016  ? Fatigue 11/20/2016  ? Unintentional weight loss 11/20/2016  ? Deaf, right 01/14/2016  ? Right-sided vestibular weakness 01/14/2016  ? 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 Chest wall pain 12/04/2014  ? Extensor hallucis longus  tendinitis of right foot 11/13/2014  ? Hair loss 03/20/2014  ? Right facial numbness 03/20/2014  ? Right-sided sensorineural hearing loss 03/20/2014  ? ? ?Past Surgical History:  ?Procedure Laterality Date  ? ACOUSTIC NEUROMA RESECTION  12/14/2012  ? neuroma removal  ? CHOLECYSTECTOMY  2010  ? ESSURE TUBAL LIGATION    ? EYE SURGERY    ? x3  ? TUBAL LIGATION    ? ? ?OB History   ? ? Gravida  ?3  ? Para  ?3  ? Term  ?   ? Preterm  ?   ? AB  ?   ? Living  ?   ?  ? ? SAB  ?   ? IAB  ?   ? Ectopic  ?   ? Multiple  ?   ? Live Births  ?   ?   ?  ?  ? ? ? ?Home Medications   ? ?Prior to Admission medications   ?Medication Sig Start Date End Date Taking? Authorizing Provider  ?cetirizine (ZYRTEC) 10 MG tablet TAKE 1 TABLET BY MOUTH EVERY DAY 06/24/20  Yes Samuel Bouche, NP  ?EPINEPHrine (EPIPEN 2-PAK) 0.3 mg/0.3 mL IJ SOAJ injection Inject 0.3 mg into the muscle as needed for anaphylaxis. 08/08/21  Yes Samuel Bouche, NP  ?loratadine-pseudoephedrine (CLARITIN-D 12-HOUR) 5-120 MG tablet Take 1 tablet by mouth daily as needed for allergies.   Yes [provider]  ?  Multiple Vitamin (MULTIVITAMIN) tablet Take 1 tablet by mouth daily.   Yes [provider]  ?NEOMYCIN-POLYMYXIN-HYDROCORTISONE (CORTISPORIN) 1 % SOLN OTIC solution Apply 1-2 drops to toe BID after soaking 08/13/21  Yes Hyatt, Max T, DPM  ?ondansetron (ZOFRAN) 8 MG tablet Take 1 tablet (8 mg total) by mouth every 8 (eight) hours as needed. 08/23/21  Yes Raylene Everts, MD  ?polyethylene glycol (MIRALAX / GLYCOLAX) 17 g packet Take 17 g by mouth daily. 08/23/21  Yes Raylene Everts, MD  ?VITAMIN D PO Take 4,000 Units by mouth daily.   Yes [provider]  ? ? ?Family History ?Family History  ?Problem Relation Age of Onset  ? Uterine cancer Sister   ? Allergic rhinitis Sister   ? Liver cancer Father   ? Cancer Father   ? Allergic rhinitis Father   ? Allergic rhinitis Mother   ? Transient ischemic attack Other   ?     grandparents  ? Hypothyroidism  Maternal Grandmother   ? Transient ischemic attack Maternal Grandfather   ? Transient ischemic attack Paternal Grandfather   ? ? ?Social History ?Social History  ? ?Tobacco Use  ? Smoking status: Never  ? Smokeless tobacco: Never  ?Vaping Use  ? Vaping Use: Never used  ?Substance Use Topics  ? Alcohol use: Yes  ?  Alcohol/week: 0.0 standard drinks  ?  Comment: rarely  ? Drug use: No  ? ? ? ?Allergies   ?Almond oil, Chocolate, and Azithromycin ? ? ?Review of Systems ?Review of Systems ? ?See HPI ?Physical Exam ?Triage Vital Signs ?ED Triage Vitals  ?Enc Vitals Group  ?   BP 08/23/21 1614 120/86  ?   Pulse Rate 08/23/21 1614 82  ?   Resp 08/23/21 1614 20  ?   Temp 08/23/21 1614 98.1 ?F (36.7 ?C)  ?   Temp Source 08/23/21 1614 Oral  ?   SpO2 08/23/21 1614 97 %  ?   Weight 08/23/21 1613 215 lb (97.5 kg)  ?   Height 08/23/21 1613 '5\' 9"'$  (1.753 m)  ?   Head Circumference --   ?   Peak Flow --   ?   Pain Score 08/23/21 1612 0  ?   Pain Loc --   ?   Pain Edu? --   ?   Excl. in [place]? --   ? ?No data found. ? ?Updated Vital Signs ?BP 120/86 (BP Location: Right Arm)   Pulse 82   Temp 98.1 ?F (36.7 ?C) (Oral)   Resp 20   Ht '5\' 9"'$  (1.753 m)   Wt 97.5 kg   LMP 08/21/2021   SpO2 97%   BMI 31.75 kg/m?  ?   ? ?Physical Exam ?Constitutional:   ?   General: She is not in acute distress. ?   Appearance: She is well-developed. She is obese.  ?HENT:  ?   Head: Normocephalic and atraumatic.  ?   Nose:  ?   Comments: Mask is in place ?Eyes:  ?   Conjunctiva/sclera: Conjunctivae normal.  ?   Pupils: Pupils are equal, round, and reactive to light.  ?Cardiovascular:  ?   Rate and Rhythm: Normal rate and regular rhythm.  ?   Heart sounds: Normal heart sounds.  ?Pulmonary:  ?   Effort: Pulmonary effort is normal. No respiratory distress.  ?   Breath sounds: Normal breath sounds.  ?Abdominal:  ?   General: There is no distension.  ?   Palpations: Abdomen  is soft.  ?   Comments: BowelRounded abdomen.  Sounds present.  Am unable to do deep  palpation secondary to pain.  She has generalized pain with all palpation.  Specific palpation of umbilicus reveals a marble sized soft movable very tender mass.  There is no redness or swelling of the soft tissue to suggest incarceration.  ?Musculoskeletal:     ?   General: Normal range of motion.  ?   Cervical back: Normal range of motion.  ?Skin: ?   General: Skin is warm and dry.  ?Neurological:  ?   General: No focal deficit present.  ?   Mental Status: She is alert.  ?Psychiatric:     ?   Mood and Affect: Mood normal.     ?   Behavior: Behavior normal.  ? ? ? ?UC Treatments / Results  ?Labs ?(all labs ordered are listed, but only abnormal results are displayed) ?Labs Reviewed  ?POCT URINALYSIS DIP (MANUAL ENTRY) - Abnormal; Notable for the following components:  ?    Result Value  ? Clarity, UA cloudy (*)   ? Spec Grav, UA >=1.030 (*)   ? Blood, UA large (*)   ? Protein Ur, POC trace (*)   ? Leukocytes, UA Small (1+) (*)   ? All other components within normal limits  ?URINE CULTURE  ? ? ?EKG ? ? ?Radiology ?No results found. ? ?Procedures ?Procedures (including critical care time) ? ?Medications Ordered in UC ?Medications - No data to display ? ?Initial Impression / Assessment and Plan / UC Course  ?I have reviewed the triage vital signs and the nursing notes. ? ?Pertinent labs & imaging results that were available during my care of the patient were reviewed by me and considered in my medical decision making (see chart for details). ? ?  ? ?Patient has constipation with distention and pain.She does have a history of an umbilical hernia but its barely palpable.  I do not feel she has an incarcerated hernia a that needs emergency surgery.  Told her to go home on pushing fluids, bland diet, to start MiraLAX.  If gets worse instead of better at any time needs to go to emergency room for additional imaging and a higher level of care is available ?Final Clinical Impressions(s) / UC Diagnoses  ? ?Final diagnoses:   ?Abdominal pain, unspecified abdominal location  ?Generalized abdominal pain  ?Nausea and vomiting, unspecified vomiting type  ? ? ? ?Discharge Instructions   ? ?  ?Make sure you are drinking lots of fluids.  Incre

## 2021-08-23 NOTE — Discharge Instructions (Addendum)
Make sure you are drinking lots of fluids.  Increase your water intake ?Take MiraLAX twice a day until you move your bowels and then once a day until regular ?If you have increasing pain, fever, or unremitting vomiting you need to go to an emergency room ? ?I have prescribed Zofran for vomiting ?

## 2021-08-25 ENCOUNTER — Other Ambulatory Visit: Payer: BC Managed Care – PPO

## 2021-08-25 LAB — URINE CULTURE
MICRO NUMBER:: 13150333
SPECIMEN QUALITY:: ADEQUATE

## 2021-08-26 ENCOUNTER — Other Ambulatory Visit: Payer: Self-pay

## 2021-08-26 ENCOUNTER — Ambulatory Visit (INDEPENDENT_AMBULATORY_CARE_PROVIDER_SITE_OTHER): Payer: BC Managed Care – PPO

## 2021-08-26 DIAGNOSIS — I619 Nontraumatic intracerebral hemorrhage, unspecified: Secondary | ICD-10-CM | POA: Diagnosis not present

## 2021-08-26 DIAGNOSIS — D333 Benign neoplasm of cranial nerves: Secondary | ICD-10-CM | POA: Diagnosis not present

## 2021-08-26 MED ORDER — GADOBUTROL 1 MMOL/ML IV SOLN
10.0000 mL | Freq: Once | INTRAVENOUS | Status: AC | PRN
  Administered 2021-08-26: 10 mL via INTRAVENOUS

## 2021-08-27 ENCOUNTER — Ambulatory Visit (INDEPENDENT_AMBULATORY_CARE_PROVIDER_SITE_OTHER): Payer: BC Managed Care – PPO | Admitting: Podiatry

## 2021-08-27 ENCOUNTER — Encounter: Payer: Self-pay | Admitting: Podiatry

## 2021-08-27 DIAGNOSIS — L600 Ingrowing nail: Secondary | ICD-10-CM

## 2021-08-27 DIAGNOSIS — Z9889 Other specified postprocedural states: Secondary | ICD-10-CM

## 2021-08-27 NOTE — Progress Notes (Signed)
She presents today for follow-up of her nail procedure.  States there is just some dried blood in there but it feels okay does not hurt anymore. ? ?Objective: There is no erythema edema cellulitis drainage or appears to be healing very nicely. ? ?Assessment: Tibial border of the hallux right demonstrate a well-healing surgical toe. ? ?Plan: Follow-up with me on an as-needed basis may discontinue treatments. ?

## 2021-08-29 ENCOUNTER — Encounter: Payer: Self-pay | Admitting: Medical-Surgical

## 2021-09-03 ENCOUNTER — Ambulatory Visit (INDEPENDENT_AMBULATORY_CARE_PROVIDER_SITE_OTHER): Payer: BC Managed Care – PPO | Admitting: Family Medicine

## 2021-09-03 ENCOUNTER — Encounter: Payer: Self-pay | Admitting: Family Medicine

## 2021-09-03 ENCOUNTER — Other Ambulatory Visit (HOSPITAL_COMMUNITY)
Admission: RE | Admit: 2021-09-03 | Discharge: 2021-09-03 | Disposition: A | Discharge status: Home / Self Care | Payer: BC Managed Care – PPO | Source: Ambulatory Visit | Attending: Family Medicine | Admitting: Family Medicine

## 2021-09-03 VITALS — BP 111/79 | HR 79 | Ht 69.0 in | Wt 221.0 lb

## 2021-09-03 DIAGNOSIS — Z01411 Encounter for gynecological examination (general) (routine) with abnormal findings: Secondary | ICD-10-CM | POA: Diagnosis not present

## 2021-09-03 DIAGNOSIS — Z124 Encounter for screening for malignant neoplasm of cervix: Secondary | ICD-10-CM | POA: Diagnosis not present

## 2021-09-03 NOTE — Progress Notes (Signed)
Subjective:  ?  ? Sarah Simmons is a 41 y.o. female and is here for a comprehensive physical exam. The patient reports no problems. ? ? ? ?The following portions of the patient's history were reviewed and updated as appropriate: allergies, current medications, past family history, past medical history, past social history, past surgical history, and problem list. ? ?Review of Systems ?Pertinent items noted in HPI and remainder of comprehensive ROS otherwise negative.  ? ?Objective:  ? ? BP 111/79   Pulse 79   Ht '5\' 9"'$  (1.753 m)   Wt 221 lb (100.2 kg)   LMP 08/21/2021   BMI 32.64 kg/m?  ?General appearance: alert, cooperative, and appears stated age ?Head: Normocephalic, without obvious abnormality, atraumatic ?Neck: no adenopathy, supple, symmetrical, trachea midline, and thyroid not enlarged, symmetric, no tenderness/mass/nodules ?Lungs: clear to auscultation bilaterally ?Breasts: normal appearance, no masses or tenderness ?Heart: regular rate and rhythm, S1, S2 normal, no murmur, click, rub or gallop ?Abdomen: soft, non-tender; bowel sounds normal; no masses,  no organomegaly ?Pelvic: cervix normal in appearance, external genitalia normal, no adnexal masses or tenderness, no cervical motion tenderness, uterus normal size, shape, and consistency, and vagina normal without discharge ?Extremities: extremities normal, atraumatic, no cyanosis or edema ?Pulses: 2+ and symmetric ?Skin: Skin color, texture, turgor normal. No rashes or lesions ?Lymph nodes: Cervical, supraclavicular, and axillary nodes normal. ?Neurologic: Grossly normal  ?  ?Assessment:  ? ? Healthy female exam.    ?  ?Plan:  ?Screening for malignant neoplasm of cervix - Plan: Cytology - PAP ? ?Encounter for gynecological examination with abnormal finding - Mammogram 07/07/2021 ? ? ?  ?See After Visit Summary for Counseling Recommendations  ? ?

## 2021-09-03 NOTE — Patient Instructions (Signed)
Preventive Care 61-41 Years Old, Female ?Preventive care refers to lifestyle choices and visits with your health care provider that can promote health and wellness. Preventive care visits are also called wellness exams. ?What can I expect for my preventive care visit? ?Counseling ?During your preventive care visit, your health care provider may ask about your: ?Medical history, including: ?Past medical problems. ?Family medical history. ?Pregnancy history. ?Current health, including: ?Menstrual cycle. ?Method of birth control. ?Emotional well-being. ?Home life and relationship well-being. ?Sexual activity and sexual health. ?Lifestyle, including: ?Alcohol, nicotine or tobacco, and drug use. ?Access to firearms. ?Diet, exercise, and sleep habits. ?Work and work Statistician. ?Sunscreen use. ?Safety issues such as seatbelt and bike helmet use. ?Physical exam ?Your health care provider may check your: ?Height and weight. These may be used to calculate your BMI (body mass index). BMI is a measurement that tells if you are at a healthy weight. ?Waist circumference. This measures the distance around your waistline. This measurement also tells if you are at a healthy weight and may help predict your risk of certain diseases, such as type 2 diabetes and high blood pressure. ?Heart rate and blood pressure. ?Body temperature. ?Skin for abnormal spots. ?What immunizations do I need? ?Vaccines are usually given at various ages, according to a schedule. Your health care provider will recommend vaccines for you based on your age, medical history, and lifestyle or other factors, such as travel or where you work. ?What tests do I need? ?Screening ?Your health care provider may recommend screening tests for certain conditions. This may include: ?Pelvic exam and Pap test. ?Lipid and cholesterol levels. ?Diabetes screening. This is done by checking your blood sugar (glucose) after you have not eaten for a while (fasting). ?Hepatitis B  test. ?Hepatitis C test. ?HIV (human immunodeficiency virus) test. ?STI (sexually transmitted infection) testing, if you are at risk. ?BRCA-related cancer screening. This may be done if you have a family history of breast, ovarian, tubal, or peritoneal cancers. ?Talk with your health care provider about your test results, treatment options, and if necessary, the need for more tests. ?Follow these instructions at home: ?Eating and drinking ? ?Eat a healthy diet that includes fresh fruits and vegetables, whole grains, lean protein, and low-fat dairy products. ?Take vitamin and mineral supplements as recommended by your health care provider. ?Do not drink alcohol if: ?Your health care provider tells you not to drink. ?You are pregnant, may be pregnant, or are planning to become pregnant. ?If you drink alcohol: ?Limit how much you have to 0-1 drink a day. ?Know how much alcohol is in your drink. In the [place] one drink equals one 12 oz bottle of beer (355 mL), one 5 oz glass of wine (148 mL), or one 1? oz glass of hard liquor (44 mL). ?Lifestyle ?Brush your teeth every morning and night with fluoride toothpaste. Floss one time each day. ?Exercise for at least 30 minutes 5 or more days each week. ?Do not use any products that contain nicotine or tobacco. These products include cigarettes, chewing tobacco, and vaping devices, such as e-cigarettes. If you need help quitting, ask your health care provider. ?Do not use drugs. ?If you are sexually active, practice safe sex. Use a condom or other form of protection to prevent STIs. ?If you do not wish to become pregnant, use a form of birth control. If you plan to become pregnant, see your health care provider for a prepregnancy visit. ?Find healthy ways to manage stress, such as: ?Meditation, yoga,  or listening to music. ?Journaling. ?Talking to a trusted person. ?Spending time with friends and family. ?Minimize exposure to UV radiation to reduce your risk of skin  cancer. ?Safety ?Always wear your seat belt while driving or riding in a vehicle. ?Do not drive: ?If you have been drinking alcohol. Do not ride with someone who has been drinking. ?If you have been using any mind-altering substances or drugs. ?While texting. ?When you are tired or distracted. ?Wear a helmet and other protective equipment during sports activities. ?If you have firearms in your house, make sure you follow all gun safety procedures. ?Seek help if you have been physically or sexually abused. ?What's next? ?Go to your health care provider once a year for an annual wellness visit. ?Ask your health care provider how often you should have your eyes and teeth checked. ?Stay up to date on all vaccines. ?This information is not intended to replace advice given to you by your health care provider. Make sure you discuss any questions you have with your health care provider. ?Document Revised: 11/20/2020 Document Reviewed: 11/20/2020 ?Elsevier Patient Education ? [place]. ? ?

## 2021-09-10 LAB — CYTOLOGY - PAP
Comment: NEGATIVE
Comment: NEGATIVE
Comment: NEGATIVE
Diagnosis: UNDETERMINED — AB
HPV 16: NEGATIVE
HPV 18 / 45: NEGATIVE
High risk HPV: POSITIVE — AB

## 2021-09-11 ENCOUNTER — Telehealth: Payer: Self-pay | Admitting: *Deleted

## 2021-09-11 NOTE — Telephone Encounter (Signed)
-----   Message from Donnamae Jude, MD sent at 09/10/2021  7:53 AM EDT ----- ?Needs colposcopy ?

## 2021-09-11 NOTE — Telephone Encounter (Signed)
Pt informed of pap results and scheduled colpo on 5/17 ?

## 2021-10-22 ENCOUNTER — Encounter: Payer: Self-pay | Admitting: Family Medicine

## 2021-10-22 ENCOUNTER — Ambulatory Visit (INDEPENDENT_AMBULATORY_CARE_PROVIDER_SITE_OTHER): Payer: BC Managed Care – PPO | Admitting: Family Medicine

## 2021-10-22 ENCOUNTER — Other Ambulatory Visit: Payer: Self-pay | Admitting: Family Medicine

## 2021-10-22 VITALS — BP 130/69 | HR 89 | Ht 69.0 in | Wt 223.8 lb

## 2021-10-22 DIAGNOSIS — Z01812 Encounter for preprocedural laboratory examination: Secondary | ICD-10-CM | POA: Diagnosis not present

## 2021-10-22 DIAGNOSIS — R87610 Atypical squamous cells of undetermined significance on cytologic smear of cervix (ASC-US): Secondary | ICD-10-CM

## 2021-10-22 DIAGNOSIS — D069 Carcinoma in situ of cervix, unspecified: Secondary | ICD-10-CM | POA: Diagnosis not present

## 2021-10-22 DIAGNOSIS — R87810 Cervical high risk human papillomavirus (HPV) DNA test positive: Secondary | ICD-10-CM | POA: Diagnosis not present

## 2021-10-22 LAB — POCT URINE PREGNANCY: Preg Test, Ur: NEGATIVE

## 2021-10-22 NOTE — Progress Notes (Signed)
UPT - Negative

## 2021-10-22 NOTE — Progress Notes (Signed)
? ? ?  GYNECOLOGY OFFICE COLPOSCOPY PROCEDURE NOTE ? ?41 y.o. [id number] here for colposcopy for ASCUS with POSITIVE high risk HPV pap smear on 08/2021. Discussed role for HPV in cervical dysplasia, need for surveillance. ? ?Patient gave informed written consent, time out was performed.  Placed in lithotomy position. Cervix viewed with speculum and colposcope after application of acetic acid.  ? ?Colposcopy adequate? Yes ? no visible lesions, acetowhite lesion(s) noted at entire anterior SCJ and posterior SCJ  punctation noted at 8 o'clock, and abnormal vessels noted at 11 o'clock; corresponding biopsies obtained.  ECC specimen obtained. ?All specimens were labeled and sent to pathology. ? ?Chaperone was present during entire procedure. ? ?Patient was given post procedure instructions.  Will follow up pathology and manage accordingly; patient will be contacted with results and recommendations.  ? ? ?Donnamae Jude, MD ?10/22/2021 ?3:27 PM ? ? ? ? ? ? ? ? ?

## 2021-10-27 ENCOUNTER — Telehealth: Payer: Self-pay | Admitting: *Deleted

## 2021-10-27 NOTE — Telephone Encounter (Signed)
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

## 2021-10-27 NOTE — Telephone Encounter (Signed)
Left message for pt to call back regarding colpo results

## 2021-10-27 NOTE — Telephone Encounter (Signed)
Returned pts call, informed of results and leep scheduled for 7/12

## 2021-12-17 ENCOUNTER — Other Ambulatory Visit: Payer: Self-pay | Admitting: Family Medicine

## 2021-12-17 ENCOUNTER — Encounter: Payer: Self-pay | Admitting: Family Medicine

## 2021-12-17 ENCOUNTER — Ambulatory Visit (INDEPENDENT_AMBULATORY_CARE_PROVIDER_SITE_OTHER): Payer: BC Managed Care – PPO | Admitting: Family Medicine

## 2021-12-17 VITALS — BP 113/79 | HR 99 | Wt 224.0 lb

## 2021-12-17 DIAGNOSIS — R350 Frequency of micturition: Secondary | ICD-10-CM | POA: Diagnosis not present

## 2021-12-17 DIAGNOSIS — D069 Carcinoma in situ of cervix, unspecified: Secondary | ICD-10-CM | POA: Insufficient documentation

## 2021-12-17 DIAGNOSIS — N871 Moderate cervical dysplasia: Secondary | ICD-10-CM | POA: Diagnosis not present

## 2021-12-17 LAB — POCT URINALYSIS DIPSTICK
Bilirubin, UA: NEGATIVE
Blood, UA: NEGATIVE
Glucose, UA: NEGATIVE
Ketones, UA: NEGATIVE
Leukocytes, UA: NEGATIVE
Nitrite, UA: NEGATIVE
Protein, UA: POSITIVE — AB
Spec Grav, UA: >=1.03 — AB (ref 1.010–1.025)
Urobilinogen, UA: 0.2 E.U./dL
pH, UA: 5 (ref 5.0–8.0)

## 2021-12-17 LAB — POCT URINE PREGNANCY: Preg Test, Ur: NEGATIVE

## 2021-12-17 NOTE — Progress Notes (Signed)
RGYN patient presents for LEEP procedure following abnormal Colpo on 10/22/21 HGSIL.   CC: frequent urination , no pain pt would like dip done.

## 2021-12-17 NOTE — Progress Notes (Signed)
   GYNECOLOGY OFFICE PROCEDURE NOTE '@NAMEBYAGE'$ @ is a 41 y.o. [id number] here for LEEP. No GYN concerns. Pap smear and colposcopy history reviewed.    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  Role of HPV,cervical dysplasia and need for close followup was empasized. Informed written consent was obtained. All questions were answered. Time out performed. Urine pregnancy test was negative.  ??Procedure: The patient was placed in lithotomy position and the bivalved coated speculum was placed in the patient's vagina. A grounding pad placed on the patient. Local anesthesia was administered via an intracervical block using 20 ml of 2% Lidocaine with epinephrine. Colposcopy performed. The suction was turned on and the Medium 1X Fisher Cone Biopsy Excisor on 40 Watts of blended current was used to excise the entire transformation zone. Excellent hemostasis was achieved using roller ball coagulation set at 50 Watts coagulation current. Monsel's solution was then applied and the speculum was removed from the vagina. Specimens were sent to pathology.  ?The patient tolerated the procedure well. Post-operative instructions given to patient, including instruction to seek medical attention for persistent bright red bleeding, fever, abdominal/pelvic pain, dysuria, nausea or vomiting. She was also told about the possibility of having copious yellow to black tinged discharge for weeks. She was counseled to avoid anything in the vagina (sex/douching/tampons) for 3 weeks.   Donnamae Jude, MD 12/17/2021 5:09 PM

## 2021-12-18 LAB — URINE CULTURE

## 2022-06-15 ENCOUNTER — Encounter: Payer: Self-pay | Admitting: Medical-Surgical

## 2022-06-15 ENCOUNTER — Ambulatory Visit (INDEPENDENT_AMBULATORY_CARE_PROVIDER_SITE_OTHER): Payer: BC Managed Care – PPO | Admitting: Medical-Surgical

## 2022-06-15 VITALS — BP 121/74 | HR 83 | Resp 20 | Ht 69.0 in | Wt 233.4 lb

## 2022-06-15 DIAGNOSIS — M6740 Ganglion, unspecified site: Secondary | ICD-10-CM | POA: Diagnosis not present

## 2022-06-15 DIAGNOSIS — Z23 Encounter for immunization: Secondary | ICD-10-CM | POA: Diagnosis not present

## 2022-06-15 DIAGNOSIS — M79605 Pain in left leg: Secondary | ICD-10-CM

## 2022-06-15 DIAGNOSIS — M79604 Pain in right leg: Secondary | ICD-10-CM | POA: Diagnosis not present

## 2022-06-15 NOTE — Progress Notes (Signed)
Established Patient Office Visit  Subjective   Patient ID: ABBAGAIL SCAFF, female   DOB: 21-Aug-1980 Age: 42 y.o. MRN: [id number]   Chief Complaint  Patient presents with   Leg Pain   Foot Pain   HPI Pleasant 42 year old female presenting today with reports of bilateral lower extremity pain for the past couple of months that starts at the knees and goes down to the feet.  Today her right leg is more affected however the left one also has symptoms at times.  Feels that the pain in her right side is behind her knee as well as posterior to the lateral malleolus extending up the Achilles.  When she is up and moving around during the day, she does not notice the symptoms as much however when she lays down at night, the discomfort seems to be more prevalent.  She has difficulty getting comfortable at night and finding good position so that she can go to sleep.  No sensation of bugs crawling, itching, or tingling.  Has tried ibuprofen on occasion and reports that it does help but tries not to take this unless she absolutely has to.  No temperature or color changes to the feet although she notes that her hands and feet always stay cold.  Also reports that she has a lump on the top of her right foot that is painful when her shoe presses on it.   Objective:    Vitals:   06/15/22 1455  BP: 121/74  Pulse: 83  Resp: 20  Height: '5\' 9"'$  (1.753 m)  Weight: 233 lb 6.4 oz (105.9 kg)  SpO2: 99%  BMI (Calculated): 34.45    Physical Exam Vitals and nursing note reviewed.  Constitutional:      General: She is not in acute distress.    Appearance: Normal appearance. She is not ill-appearing.  HENT:     Head: Normocephalic and atraumatic.  Cardiovascular:     Rate and Rhythm: Normal rate and regular rhythm.     Pulses: Normal pulses.          Posterior tibial pulses are 2+ on the right side and 2+ on the left side.     Heart sounds: Normal heart sounds.     Comments: Varicose veins noted to bilateral  lower extremities anteriorly along the shin. Pulmonary:     Effort: Pulmonary effort is normal. No respiratory distress.     Breath sounds: Normal breath sounds. No wheezing, rhonchi or rales.  Musculoskeletal:       Feet:  Skin:    General: Skin is warm and dry.  Neurological:     Mental Status: She is alert and oriented to person, place, and time.  Psychiatric:        Mood and Affect: Mood normal.        Behavior: Behavior normal.        Thought Content: Thought content normal.        Judgment: Judgment normal.   No results found for this or any previous visit (from the past 24 hour(s)).     The 10-year ASCVD risk score (Arnett DK, et al., 2019) is: 0.6%   Values used to calculate the score:     Age: 89 years     Sex: Female     Is Non-Hispanic African American: No     Diabetic: No     Tobacco smoker: No     Systolic Blood Pressure: 790 mmHg     Is BP treated:  No     HDL Cholesterol: 56 mg/dL     Total Cholesterol: 205 mg/dL   Assessment & Plan:   1. Pain in both lower extremities Unclear etiology.  Consider venous insufficiency, restless leg syndrome, or MSK etiology.  Checking labs as below.  Presentation not necessarily consistent with DVT however after discussion, patient agrees that this would provide some peace of mind.  Bilateral lower extremity ultrasound ordered. - VITAMIN D 25 Hydroxy (Vit-D Deficiency, Fractures) - CBC - COMPLETE METABOLIC PANEL WITH GFR - Sed Rate (ESR) - C-reactive protein - Hemoglobin A1c - Magnesium - ANA - US Venous Img Lower Bilateral (DVT); Future  2. Ganglion cyst Presentation consistent with ganglion cyst.  Would like to get her in with Dr. Dianah Field for treatment.  Advised her that ganglion cyst have a 50% chance of recurring and this may need to be treated again in the future if it comes back.  Return if symptoms worsen or fail to improve.  ___________________________________________ Clearnce Sorrel, DNP, APRN,  FNP-BC [facility] [place]

## 2022-06-17 ENCOUNTER — Ambulatory Visit (INDEPENDENT_AMBULATORY_CARE_PROVIDER_SITE_OTHER): Payer: BC Managed Care – PPO

## 2022-06-17 DIAGNOSIS — M79605 Pain in left leg: Secondary | ICD-10-CM | POA: Diagnosis not present

## 2022-06-17 DIAGNOSIS — M79661 Pain in right lower leg: Secondary | ICD-10-CM | POA: Diagnosis not present

## 2022-06-17 DIAGNOSIS — M79604 Pain in right leg: Secondary | ICD-10-CM

## 2022-06-17 DIAGNOSIS — M79662 Pain in left lower leg: Secondary | ICD-10-CM | POA: Diagnosis not present

## 2022-06-17 LAB — COMPLETE METABOLIC PANEL WITH GFR
AG Ratio: 1.2 (calc) (ref 1.0–2.5)
ALT: 27 U/L (ref 6–29)
AST: 12 U/L (ref 10–30)
Albumin: 3.9 g/dL (ref 3.6–5.1)
Alkaline phosphatase (APISO): 107 U/L (ref 31–125)
BUN: 16 mg/dL (ref 7–25)
CO2: 27 mmol/L (ref 20–32)
Calcium: 8.9 mg/dL (ref 8.6–10.2)
Chloride: 106 mmol/L (ref 98–110)
Creat: 0.88 mg/dL (ref 0.50–0.99)
Globulin: 3.2 g/dL (calc) (ref 1.9–3.7)
Glucose, Bld: 90 mg/dL (ref 65–99)
Potassium: 4.6 mmol/L (ref 3.5–5.3)
Sodium: 139 mmol/L (ref 135–146)
Total Bilirubin: 0.7 mg/dL (ref 0.2–1.2)
Total Protein: 7.1 g/dL (ref 6.1–8.1)
eGFR: 85 mL/min/{1.73_m2} (ref >=60)

## 2022-06-17 LAB — CBC
HCT: 42.2 % (ref 35.0–45.0)
Hemoglobin: 14.3 g/dL (ref 11.7–15.5)
MCH: 27.7 pg (ref 27.0–33.0)
MCHC: 33.9 g/dL (ref 32.0–36.0)
MCV: 81.6 fL (ref 80.0–100.0)
MPV: 10.4 fL (ref 7.5–12.5)
Platelets: 396 10*3/uL (ref 140–400)
RBC: 5.17 10*6/uL — ABNORMAL HIGH (ref 3.80–5.10)
RDW: 13 % (ref 11.0–15.0)
WBC: 8.1 10*3/uL (ref 3.8–10.8)

## 2022-06-17 LAB — SEDIMENTATION RATE: Sed Rate: 19 mm/h (ref 0–20)

## 2022-06-17 LAB — HEMOGLOBIN A1C
Hgb A1c MFr Bld: 5.5 % of total Hgb (ref <5.7)
Mean Plasma Glucose: 111 mg/dL
eAG (mmol/L): 6.2 mmol/L

## 2022-06-17 LAB — C-REACTIVE PROTEIN: CRP: 12 mg/L — ABNORMAL HIGH (ref <8.0)

## 2022-06-17 LAB — ANTI-NUCLEAR AB-TITER (ANA TITER)
ANA TITER: 1:40 {titer} — ABNORMAL HIGH
ANA Titer 1: 1:40 {titer} — ABNORMAL HIGH

## 2022-06-17 LAB — VITAMIN D 25 HYDROXY (VIT D DEFICIENCY, FRACTURES): Vit D, 25-Hydroxy: 29 ng/mL — ABNORMAL LOW (ref 30–100)

## 2022-06-17 LAB — ANA: Anti Nuclear Antibody (ANA): POSITIVE — AB

## 2022-06-17 LAB — MAGNESIUM: Magnesium: 2.1 mg/dL (ref 1.5–2.5)

## 2022-06-18 ENCOUNTER — Other Ambulatory Visit: Payer: Self-pay | Admitting: Medical-Surgical

## 2022-06-18 ENCOUNTER — Encounter: Payer: Self-pay | Admitting: Medical-Surgical

## 2022-06-18 DIAGNOSIS — M79604 Pain in right leg: Secondary | ICD-10-CM

## 2022-06-18 DIAGNOSIS — R768 Other specified abnormal immunological findings in serum: Secondary | ICD-10-CM

## 2022-06-18 DIAGNOSIS — R7982 Elevated C-reactive protein (CRP): Secondary | ICD-10-CM

## 2022-06-19 ENCOUNTER — Encounter: Payer: Self-pay | Admitting: Sports Medicine

## 2022-06-19 ENCOUNTER — Ambulatory Visit (INDEPENDENT_AMBULATORY_CARE_PROVIDER_SITE_OTHER): Payer: BC Managed Care – PPO | Admitting: Sports Medicine

## 2022-06-19 ENCOUNTER — Ambulatory Visit (INDEPENDENT_AMBULATORY_CARE_PROVIDER_SITE_OTHER): Payer: BC Managed Care – PPO

## 2022-06-19 VITALS — BP 102/68 | HR 79

## 2022-06-19 DIAGNOSIS — M67471 Ganglion, right ankle and foot: Secondary | ICD-10-CM

## 2022-06-19 MED ORDER — TRIAMCINOLONE ACETONIDE 40 MG/ML IJ SUSP
40.0000 mg | Freq: Once | INTRAMUSCULAR | Status: AC
  Administered 2022-06-19: 40 mg via INTRAMUSCULAR

## 2022-06-19 NOTE — Progress Notes (Signed)
    Procedures performed today:    Procedure: Real-time Ultrasound Guided aspiration/injection of right dorsal foot ganglion Device: Samsung HS60  Verbal informed consent obtained.  Time-out conducted.  Noted no overlying erythema, induration, or other signs of local infection.  Skin prepped in a sterile fashion.  Local anesthesia: Topical Ethyl chloride.  With sterile technique and under real time ultrasound guidance: Noted ganglion cyst, aspirated 78m thick fluid with a 18-gauge needle, syringe switched and 0.5 cc kenalog 40, 0.5 cc lidocaine injected easily. Completed without difficulty  Advised to call if fevers/chills, erythema, induration, drainage, or persistent bleeding.  Images permanently stored and available for review in PACS.  Impression: Technically successful ultrasound guided aspiration/injection.  Independent interpretation of notes and tests performed by another provider:   None.  Brief History, Exam, Impression, and Recommendations:    Ganglion cyst of right foot Simple ganglion right dorsal foot, aspiration and injection, patient understands recurrence rate, return to see me 6 weeks.    ____________________________________________ TGwen Her TDianah Field M.D., ABFM., CAQSM., AME. Primary Care and Sports Medicine [facility] Adjunct Professor of [employer] [facility]  [employer] manager

## 2022-06-19 NOTE — Assessment & Plan Note (Addendum)
Simple ganglion right dorsal foot, aspiration and injection, patient understands recurrence rate, return to see me 6 weeks.

## 2022-07-07 ENCOUNTER — Other Ambulatory Visit: Payer: Self-pay

## 2022-07-07 DIAGNOSIS — R768 Other specified abnormal immunological findings in serum: Secondary | ICD-10-CM

## 2022-07-07 DIAGNOSIS — R7982 Elevated C-reactive protein (CRP): Secondary | ICD-10-CM

## 2022-07-07 DIAGNOSIS — M79604 Pain in right leg: Secondary | ICD-10-CM

## 2022-07-22 DIAGNOSIS — M359 Systemic involvement of connective tissue, unspecified: Secondary | ICD-10-CM | POA: Diagnosis not present

## 2022-07-22 DIAGNOSIS — M25571 Pain in right ankle and joints of right foot: Secondary | ICD-10-CM | POA: Diagnosis not present

## 2022-07-22 DIAGNOSIS — M79671 Pain in right foot: Secondary | ICD-10-CM | POA: Diagnosis not present

## 2022-07-22 DIAGNOSIS — M25572 Pain in left ankle and joints of left foot: Secondary | ICD-10-CM | POA: Diagnosis not present

## 2022-07-22 DIAGNOSIS — M1990 Unspecified osteoarthritis, unspecified site: Secondary | ICD-10-CM | POA: Diagnosis not present

## 2022-07-22 DIAGNOSIS — M79672 Pain in left foot: Secondary | ICD-10-CM | POA: Diagnosis not present

## 2022-07-22 DIAGNOSIS — M791 Myalgia, unspecified site: Secondary | ICD-10-CM | POA: Diagnosis not present

## 2022-07-31 ENCOUNTER — Ambulatory Visit: Payer: BC Managed Care – PPO | Admitting: Sports Medicine

## 2022-08-05 DIAGNOSIS — M7060 Trochanteric bursitis, unspecified hip: Secondary | ICD-10-CM | POA: Diagnosis not present

## 2022-08-05 DIAGNOSIS — M1990 Unspecified osteoarthritis, unspecified site: Secondary | ICD-10-CM | POA: Diagnosis not present

## 2022-08-05 DIAGNOSIS — M359 Systemic involvement of connective tissue, unspecified: Secondary | ICD-10-CM | POA: Diagnosis not present

## 2022-08-05 DIAGNOSIS — M791 Myalgia, unspecified site: Secondary | ICD-10-CM | POA: Diagnosis not present

## 2022-08-07 DIAGNOSIS — H5203 Hypermetropia, bilateral: Secondary | ICD-10-CM | POA: Diagnosis not present

## 2022-08-07 DIAGNOSIS — H52213 Irregular astigmatism, bilateral: Secondary | ICD-10-CM | POA: Diagnosis not present

## 2022-09-20 NOTE — Progress Notes (Deleted)
Office Visit Note  Patient: Sarah Simmons             Date of Birth: Jul 20, 1980           MRN: [id number]             PCP: Christen Butter, NP Referring: Christen Butter, NP Visit Date: 09/21/2022 Occupation: @GUAROCC @  Subjective:  No chief complaint on file.   History of Present Illness: Sarah Simmons is a 42 y.o. female here for evaluation of leg pains with positive ANA and elevated CRP.  She had a recent evaluation with Dr. Kathi Ludwig without specific evidence of autoimmune disease or joint inflammation noted and AVISE CTD labs ordered. ***     Activities of Daily Living:  Patient reports morning stiffness for *** {minute/hour:19697}.   Patient {ACTIONS;DENIES/REPORTS:21021675::"Denies"} nocturnal pain.  Difficulty dressing/grooming: {ACTIONS;DENIES/REPORTS:21021675::"Denies"} Difficulty climbing stairs: {ACTIONS;DENIES/REPORTS:21021675::"Denies"} Difficulty getting out of chair: {ACTIONS;DENIES/REPORTS:21021675::"Denies"} Difficulty using hands for taps, buttons, cutlery, and/or writing: {ACTIONS;DENIES/REPORTS:21021675::"Denies"}  No Rheumatology ROS completed.   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   Hair loss 03/20/2014   Right facial numbness 03/20/2014   Right-sided sensorineural hearing loss 03/20/2014    Past Medical History:  Diagnosis Date   Acoustic neuroma (HCC)    Anemia    Angio-edema    Nephrolithiasis    Papanicolaou smear of cervix with low risk human papillomavirus (HPV) DNA test positive 12/01/2017   Repeat  Pap June 2020   Urticaria     Family History  Problem Relation Age of Onset   Transient ischemic attack Mother    Stroke Mother    Allergic rhinitis Mother    Heart failure Mother    Liver cancer Father    Cancer Father        testicular, Liver   Allergic rhinitis Father    Uterine cancer Sister        GTD   Allergic rhinitis Sister    Hypothyroidism Maternal Grandmother    Transient ischemic attack Maternal Grandfather    Transient ischemic attack Paternal Grandfather    Transient ischemic attack Other        grandparents   Past Surgical History:  Procedure Laterality Date   ACOUSTIC NEUROMA RESECTION  12/14/2012   neuroma removal   CHOLECYSTECTOMY  2010   ESSURE TUBAL LIGATION     EYE SURGERY     x3   Social History   Social History Narrative   Patient lives at home with family.   Originally from [place], [place].      [facility] 2015.      RN with hospice care [place]      Caffeine Use: occ.   Immunization History  Administered Date(s) Administered   Influenza,inj,Quad PF,6+ Mos 04/19/2019, 06/15/2022   PPD Test 03/18/2020   Tdap 11/12/2008, 04/19/2019     Objective: Vital Signs: There were no vitals taken for this visit.   Physical Exam   Musculoskeletal Exam: ***  CDAI Exam: CDAI Score: -- Patient Global: --; Provider Global: -- Swollen: --; Tender: --  Joint Exam 09/21/2022   No joint exam has been documented for this visit   There is currently no information documented on the homunculus. Go to the Rheumatology activity and complete the homunculus joint exam.  Investigation: No additional findings.  Imaging: No results found.  Recent Labs: Lab Results  Component Value Date   WBC 8.1 06/15/2022   HGB 14.3 06/15/2022   PLT 396 06/15/2022   NA 139 06/15/2022   K 4.6 06/15/2022   CL 106 06/15/2022   CO2 27 06/15/2022   GLUCOSE 90 06/15/2022   BUN 16 06/15/2022   CREATININE 0.88 06/15/2022   BILITOT 0.7 06/15/2022    ALKPHOS 93 04/11/2019   AST 12 06/15/2022   ALT 27 06/15/2022   PROT 7.1 06/15/2022   ALBUMIN QUANTITY NOT SUFFICIENT, UNABLE TO PERFORM TEST 04/11/2019   CALCIUM 8.9 06/15/2022   GFRAA NOT CALCULATED 04/11/2019    Speciality Comments: No specialty comments available.  Procedures:  No procedures performed Allergies: Almond oil, Chocolate, and Azithromycin   Assessment / Plan:     Visit Diagnoses: No diagnosis found.  Orders: No orders of the defined types were placed in this encounter.  No orders of the defined types were placed in this encounter.   Face-to-face time spent with patient was *** minutes. Greater than 50% of time was spent in counseling and coordination of care.  Follow-Up Instructions: No follow-ups on file.   Fuller Plan, MD  Note - This record has been created using [employer].  Chart creation errors have been sought, but may not always  have been located. Such creation errors do not reflect on  the standard of medical care.

## 2022-09-21 ENCOUNTER — Encounter: Payer: BC Managed Care – PPO | Admitting: Internal Medicine

## 2022-10-19 ENCOUNTER — Other Ambulatory Visit: Payer: Self-pay | Admitting: Medical-Surgical

## 2022-10-19 DIAGNOSIS — Z0000 Encounter for general adult medical examination without abnormal findings: Secondary | ICD-10-CM

## 2022-11-09 ENCOUNTER — Ambulatory Visit
Admission: RE | Admit: 2022-11-09 | Discharge: 2022-11-09 | Disposition: A | Discharge status: Home / Self Care | Payer: BC Managed Care – PPO | Source: Ambulatory Visit | Attending: Medical-Surgical | Admitting: Medical-Surgical

## 2022-11-09 DIAGNOSIS — Z1231 Encounter for screening mammogram for malignant neoplasm of breast: Secondary | ICD-10-CM | POA: Diagnosis not present

## 2022-11-09 DIAGNOSIS — Z0000 Encounter for general adult medical examination without abnormal findings: Secondary | ICD-10-CM

## 2023-04-06 ENCOUNTER — Encounter: Payer: Self-pay | Admitting: Family Medicine

## 2023-04-06 ENCOUNTER — Ambulatory Visit (INDEPENDENT_AMBULATORY_CARE_PROVIDER_SITE_OTHER): Payer: BC Managed Care – PPO | Admitting: Family Medicine

## 2023-04-06 VITALS — BP 119/82 | HR 83 | Ht 69.0 in | Wt 236.5 lb

## 2023-04-06 DIAGNOSIS — B3731 Acute candidiasis of vulva and vagina: Secondary | ICD-10-CM | POA: Insufficient documentation

## 2023-04-06 MED ORDER — NYSTATIN 100000 UNIT/GM EX POWD: 1.0000 | Freq: Three times a day (TID) | CUTANEOUS | 0 refills | Status: DC

## 2023-04-06 MED ORDER — NYSTATIN-TRIAMCINOLONE 100000-0.1 UNIT/GM-% EX OINT: 1.0000 | TOPICAL_OINTMENT | Freq: Two times a day (BID) | CUTANEOUS | 0 refills | Status: DC

## 2023-04-06 MED ORDER — FLUCONAZOLE 150 MG PO TABS: 150.0000 mg | ORAL_TABLET | Freq: Once | ORAL | 0 refills | Status: AC

## 2023-04-06 NOTE — Assessment & Plan Note (Signed)
-   given nystatin cream and powder along with diflucan - if no better in one week follow up with pcp

## 2023-04-06 NOTE — Progress Notes (Signed)
   Acute Office Visit  Subjective:     Patient ID: Sarah Simmons, female    DOB: 22-Feb-1981, 42 y.o.   MRN: [id number]  Chief Complaint  Patient presents with   Vaginitis    Red, and itching    HPI Patient is in today for concerns of a yeast infection. Was put on keflex and prednisone at OSH. For the past few days she has been having burning and itching in the vaginal area.   Review of Systems  Constitutional:  Negative for chills and fever.  Respiratory:  Negative for cough and shortness of breath.   Cardiovascular:  Negative for chest pain.  Neurological:  Negative for headaches.        Objective:    BP 119/82 (BP Location: Left Arm, Patient Position: Sitting, Cuff Size: Normal)   Pulse 83   Ht 5\' 9"  (1.753 m)   Wt 236 lb 8 oz (107.3 kg)   SpO2 97%   BMI 34.92 kg/m    Physical Exam Vitals and nursing note reviewed.  Constitutional:      General: She is not in acute distress.    Appearance: Normal appearance. She is well-developed.  HENT:     Head: Normocephalic and atraumatic.     Right Ear: External ear normal.     Left Ear: External ear normal.     Nose: Nose normal.  Eyes:     Conjunctiva/sclera: Conjunctivae normal.  Cardiovascular:     Rate and Rhythm: Normal rate and regular rhythm.  Pulmonary:     Effort: Pulmonary effort is normal.     Breath sounds: Normal breath sounds.  Skin:    General: Skin is dry.     Coloration: Skin is not pale.  Neurological:     General: No focal deficit present.     Mental Status: She is alert and oriented to person, place, and time.  Psychiatric:        Mood and Affect: Mood normal.        Behavior: Behavior normal.        Thought Content: Thought content normal.        Judgment: Judgment normal.     No results found for any visits on 04/06/23.      Assessment & Plan:   Problem List Items Addressed This Visit       Genitourinary   Vaginal candidiasis - Primary    - given nystatin cream and powder along  with diflucan - if no better in one week follow up with pcp      Relevant Medications   fluconazole (DIFLUCAN) 150 MG tablet   nystatin-triamcinolone ointment (MYCOLOG)   nystatin (MYCOSTATIN/NYSTOP) powder    Meds ordered this encounter  Medications   fluconazole (DIFLUCAN) 150 MG tablet    Sig: Take 1 tablet (150 mg total) by mouth once for 1 dose. If no better in 72 hours take second pill    Dispense:  2 tablet    Refill:  0   nystatin-triamcinolone ointment (MYCOLOG)    Sig: Apply 1 Application topically 2 (two) times daily.    Dispense:  30 g    Refill:  0   nystatin (MYCOSTATIN/NYSTOP) powder    Sig: Apply 1 Application topically 3 (three) times daily.    Dispense:  15 g    Refill:  0    Return if symptoms worsen or fail to improve with PCP.  Charlton Amor, DO

## 2023-05-30 ENCOUNTER — Emergency Department
Admission: EM | Admit: 2023-05-30 | Discharge: 2023-05-30 | Disposition: A | Discharge status: Home / Self Care | Payer: BC Managed Care – PPO | Attending: Emergency Medicine | Admitting: Emergency Medicine

## 2023-05-30 ENCOUNTER — Other Ambulatory Visit: Payer: Self-pay

## 2023-05-30 ENCOUNTER — Emergency Department: Payer: BC Managed Care – PPO

## 2023-05-30 DIAGNOSIS — R531 Weakness: Secondary | ICD-10-CM | POA: Diagnosis not present

## 2023-05-30 DIAGNOSIS — R55 Syncope and collapse: Secondary | ICD-10-CM | POA: Diagnosis not present

## 2023-05-30 DIAGNOSIS — M79601 Pain in right arm: Secondary | ICD-10-CM | POA: Diagnosis not present

## 2023-05-30 DIAGNOSIS — R0789 Other chest pain: Secondary | ICD-10-CM | POA: Insufficient documentation

## 2023-05-30 DIAGNOSIS — R0602 Shortness of breath: Secondary | ICD-10-CM | POA: Diagnosis not present

## 2023-05-30 DIAGNOSIS — R002 Palpitations: Secondary | ICD-10-CM | POA: Diagnosis not present

## 2023-05-30 LAB — BASIC METABOLIC PANEL
Anion gap: 10 (ref 5–15)
BUN: 13 mg/dL (ref 6–20)
CO2: 22 mmol/L (ref 22–32)
Calcium: 8.2 mg/dL — ABNORMAL LOW (ref 8.9–10.3)
Chloride: 104 mmol/L (ref 98–111)
Creatinine, Ser: 0.92 mg/dL (ref 0.44–1.00)
GFR, Estimated: >60 mL/min (ref >60)
Glucose, Bld: 108 mg/dL — ABNORMAL HIGH (ref 70–99)
Potassium: 3.7 mmol/L (ref 3.5–5.1)
Sodium: 136 mmol/L (ref 135–145)

## 2023-05-30 LAB — TROPONIN I (HIGH SENSITIVITY)
Troponin I (High Sensitivity): <2 ng/L (ref <18)
Troponin I (High Sensitivity): <2 ng/L (ref <18)

## 2023-05-30 LAB — CBC
HCT: 41.6 % (ref 36.0–46.0)
Hemoglobin: 13.5 g/dL (ref 12.0–15.0)
MCH: 26.5 pg (ref 26.0–34.0)
MCHC: 32.5 g/dL (ref 30.0–36.0)
MCV: 81.7 fL (ref 80.0–100.0)
Platelets: 373 10*3/uL (ref 150–400)
RBC: 5.09 MIL/uL (ref 3.87–5.11)
RDW: 13.6 % (ref 11.5–15.5)
WBC: 9.1 10*3/uL (ref 4.0–10.5)
nRBC: 0 % (ref 0.0–0.2)

## 2023-05-30 LAB — D-DIMER, QUANTITATIVE: D-Dimer, Quant: <0.27 ug{FEU}/mL (ref 0.00–0.50)

## 2023-05-30 NOTE — ED Notes (Signed)
Lab work not pulled in triage. Delay in blood work at this time.

## 2023-05-30 NOTE — ED Provider Notes (Signed)
[facility] Provider Note    Event Date/Time   First MD Initiated Contact with Patient 05/30/23 1354     (approximate)   History   Near Syncope   HPI  AALANAH DEXHEIMER is a 42 y.o. female with a history of acoustic neuroma  Today felt well went to church.  Reports while seated at church she started to feel lightheaded as though she was going to pass out.  Did not pass out but felt a little bit of a sense of like a slight discomfort or tightness in the right arm and some in the chest that has since gone away.  Advised had something to eat but did not hydrate well today.  Has been in her normal health.  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  She also advised she checked her heart rate at home and it was 116 her blood pressure was 170  Denies any bleeding.  No heavy discharge or vaginal bleeding   Patient advises no risk of pregnancy.  She has had a tubal ligation and her husband is also been 'fixed'  Physical Exam   Triage Vital Signs: ED Triage Vitals  Encounter Vitals Group     BP 05/30/23 1333 136/83     Systolic BP Percentile --      Diastolic BP Percentile --      Pulse Rate 05/30/23 1331 (!) 113     Resp 05/30/23 1331 18     Temp 05/30/23 1331 98.5 F (36.9 C)     Temp Source 05/30/23 1331 Oral     SpO2 05/30/23 1331 98 %     Weight 05/30/23 1332 220 lb (99.8 kg)     Height 05/30/23 1332 5\' 8"  (1.727 m)     Head Circumference --      Peak Flow --      Pain Score 05/30/23 1332 4     Pain Loc --      Pain Education --      Exclude from Growth Chart --     Most recent vital signs: Vitals:   05/30/23 1331 05/30/23 1333  BP:  136/83  Pulse: (!) 113   Resp: 18   Temp: 98.5 F (36.9 C)   SpO2: 98%      General: Awake, no distress.  CV:  Good peripheral perfusion.  Normal tones  and rate Resp:  Normal effort.  Clear bilateral with normal work of breathing Abd:  No distention.  Soft nontender Other:  No lower extremity edema or unilateral leg swelling.  No venous congestion   ED Results / Procedures / Treatments   Labs (all labs ordered are listed, but only abnormal results are displayed) Labs Reviewed  BASIC METABOLIC PANEL - Abnormal; Notable for the following components:      Result Value   Glucose, Bld 108 (*)    Calcium 8.2 (*)    All other components within normal limits  CBC  D-DIMER, QUANTITATIVE  TROPONIN I (HIGH SENSITIVITY)  TROPONIN I (HIGH SENSITIVITY)     EKG  And interpreted by me at 1340 heart rate 95 QRS 90 QTc 400 Borderline sinus tachycardia.  Minimal nonspecific T wave abnormality.  No evidence of frank ischemia   RADIOLOGY  Chest x-ray interpreted by me as negative for acute  finding   DG Chest 2 View Result Date: 05/30/2023 CLINICAL DATA:  Shortness of breath. Patient was ensured urge and started to feel very faint. Weakness and shortness of breath. EXAM: CHEST - 2 VIEW COMPARISON:  09/12/18 FINDINGS: The heart size and mediastinal contours are within normal limits. Both lungs are clear. The visualized skeletal structures are unremarkable. IMPRESSION: No active cardiopulmonary disease. Electronically Signed   By: Signa Kell M.D.   On: 05/30/2023 14:12      PROCEDURES:  Critical Care performed: No  Procedures   MEDICATIONS ORDERED IN ED: Medications - No data to display   IMPRESSION / MDM / ASSESSMENT AND PLAN / ED COURSE  I reviewed the triage vital signs and the nursing notes.                              Differential diagnosis includes, but is not limited to, orthostatic, vasovagal, cardiac dysrhythmia though no palpitations noted today and advises when she did feel slight palpitation last couple weeks she did not have any syncopal-like episode, ACS though this seems unlikely given resolution of symptoms and  reassuring ECG, cardiomyopathy, valvular disease, anemia etc.  No noted risk factors for PE.  No history of the same, no estrogen use does not smoke no recent long travel no leg swelling no surgery.  No associate abdominal pain no infectious symptoms.  She is resting comfortably now and seems to be recovered well heart rate now in the 90s.  Patient's presentation is most consistent with acute complicated illness / injury requiring diagnostic workup.   The patient is on the cardiac monitor to evaluate for evidence of arrhythmia and/or significant heart rate changes.  Clinical Course as of 05/30/23 1538  Sun May 30, 2023  1536 On patient resting comfortably.  Initial troponin normal.  CBC metabolic panel normal with exception to slight hypocalcemia.  Low risk for PE with negative/exclusionary D-dimer [MQ]  1536 Patient is resting, feels well at this time.  Current plan of care is to follow-up on repeat troponin and if patient remains well, discharged with careful return precautions and referral to cardiology. Return precautions and treatment recommendations and follow-up discussed with the patient who is agreeable with the plan.  [MQ]  1537 Dr. Larinda Buttery to follow-up on repeat trop / reassessment. [MQ]    Clinical Course User Index [MQ] Sharyn Creamer, MD     FINAL CLINICAL IMPRESSION(S) / ED DIAGNOSES   Final diagnoses:  Near syncope  Palpitation     Rx / DC Orders   ED Discharge Orders          Ordered    Ambulatory referral to Cardiology        05/30/23 1538             Note:  This document was prepared using Dragon voice recognition software and may include unintentional dictation errors.   Sharyn Creamer, MD 05/30/23 1538

## 2023-05-30 NOTE — ED Triage Notes (Signed)
Pt sts that she was in church and started to feel very faint like. Pt sts that she is feeling weak and SOB also.

## 2023-05-30 NOTE — ED Provider Notes (Signed)
-----------------------------------------   3:10 PM on 05/30/2023 -----------------------------------------  Blood pressure 136/83, pulse (!) 113, temperature 98.5 F (36.9 C), temperature source Oral, resp. rate 18, height 5\' 8"  (1.727 m), weight 99.8 kg, last menstrual period 05/17/2023, SpO2 98%.  Assuming care from Dr. Fanny Bien.  In short, Sarah Simmons is a 42 y.o. female with a chief complaint of Near Syncope .  Refer to the original H&P for additional details.  The current plan of care is to follow-up repeat troponin and reassess following near syncopal episode.  ----------------------------------------- 4:35 PM on 05/30/2023 ----------------------------------------- Repeat troponin within normal limits, patient asymptomatic on reassessment.  She is appropriate for discharge home with outpatient cardiology follow-up, referral provided by previous provider.  She was counseled to return to the ED for new or worsening symptoms, patient agrees with plan.    Chesley Noon, MD 05/30/23 [phone]

## 2023-06-03 ENCOUNTER — Other Ambulatory Visit: Payer: Self-pay

## 2023-06-03 ENCOUNTER — Ambulatory Visit
Admission: EM | Admit: 2023-06-03 | Discharge: 2023-06-03 | Disposition: A | Discharge status: Home / Self Care | Payer: BC Managed Care – PPO | Attending: Family Medicine | Admitting: Family Medicine

## 2023-06-03 DIAGNOSIS — R6889 Other general symptoms and signs: Secondary | ICD-10-CM | POA: Diagnosis not present

## 2023-06-03 LAB — POCT INFLUENZA A/B
Influenza A, POC: NEGATIVE
Influenza B, POC: NEGATIVE

## 2023-06-03 LAB — POC SARS CORONAVIRUS 2 AG -  ED: SARS Coronavirus 2 Ag: NEGATIVE

## 2023-06-03 MED ORDER — PROMETHAZINE-DM 6.25-15 MG/5ML PO SYRP: 5.0000 mL | ORAL_SOLUTION | Freq: Four times a day (QID) | ORAL | 0 refills | Status: DC | PRN

## 2023-06-03 MED ORDER — DOXYCYCLINE HYCLATE 100 MG PO CAPS: 100.0000 mg | ORAL_CAPSULE | Freq: Two times a day (BID) | ORAL | 0 refills | Status: DC

## 2023-06-03 NOTE — ED Triage Notes (Signed)
Has been ill since Tuesday with cough, fevers, head congestion, rhinorrhea. Has taken ibuprofen, allergy medication, elderberry.

## 2023-06-03 NOTE — ED Provider Notes (Signed)
[facility]    CSN: [id number] Arrival date & time: 06/03/23  0900      History   Chief Complaint No chief complaint on file.   HPI Sarah Simmons is a 42 y.o. female.   HPI   Patient is here with an upper respiratory infection.  She has been sick for 4 to 5 days.  Cough, congestion, postnasal drip, fatigue, vomiting.  She states that she has coughing spells where she coughs so hard that she vomits.  She does feel some chest congestion.  Non-smoker.  No underlying asthma or lung disease. Past Medical History:  Diagnosis Date   Acoustic neuroma (HCC)    Anemia    Angio-edema    Nephrolithiasis    Papanicolaou smear of cervix with low risk human papillomavirus (HPV) DNA test positive 12/01/2017   Repeat Pap June 2020   Urticaria     Patient Active Problem List   Diagnosis Date Noted   Vaginal candidiasis 04/06/2023   Ganglion cyst of right foot 06/19/2022   High grade squamous intraepithelial lesion (HGSIL), grade 3 CIN, on biopsy of cervix 12/17/2021   Class 1 obesity due to excess calories without serious comorbidity in adult 11/29/2017   Umbilical hernia without obstruction and without gangrene 11/29/2017   Vitamin D deficiency 11/29/2017   Encounter for post Essure sterilization check 11/29/2017   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 Authorizing Provider  doxycycline  (VIBRAMYCIN) 100 MG capsule Take 1 capsule (100 mg total) by mouth 2 (two) times daily. 06/03/23  Yes Eustace Moore, MD  promethazine-dextromethorphan (PROMETHAZINE-DM) 6.25-15 MG/5ML syrup Take 5 mLs by mouth 4 (four) times daily as needed for cough. 06/03/23  Yes Eustace Moore, MD  cetirizine (ZYRTEC) 10 MG tablet TAKE 1 TABLET BY MOUTH EVERY DAY 06/24/20   Christen Butter, NP  EPINEPHrine (EPIPEN 2-PAK) 0.3 mg/0.3 mL IJ SOAJ injection Inject 0.3 mg into the muscle as needed for anaphylaxis. 08/08/21   Christen Butter, NP  Multiple Vitamin (MULTIVITAMIN) tablet Take 1 tablet by mouth daily.    [provider]  VITAMIN D PO Take 4,000 Units by mouth daily.    [provider]    Family History Family History  Problem Relation Age of Onset   Transient ischemic attack Mother    Stroke Mother    Allergic rhinitis Mother    Heart failure Mother    Liver cancer Father    Cancer Father        testicular, Liver   Allergic rhinitis Father    Uterine cancer Sister  GTD   Allergic rhinitis Sister    Hypothyroidism Maternal Grandmother    Transient ischemic attack Maternal Grandfather    Transient ischemic attack Paternal Grandfather    Transient ischemic attack Other        grandparents   Breast cancer Neg Hx     Social History Social History   Tobacco Use   Smoking status: Never   Smokeless tobacco: Never  Vaping Use   Vaping status: Never Used  Substance Use Topics   Alcohol use: Yes    Alcohol/week: 0.0 standard drinks of alcohol    Comment: rarely   Drug use: No     Allergies   Almond oil, Chocolate, and Azithromycin   Review of Systems Review of Systems See HPI  Physical Exam Triage Vital Signs ED Triage Vitals  Encounter Vitals Group     BP 06/03/23 1019 139/77     Systolic BP Percentile --      Diastolic BP Percentile --      Pulse Rate 06/03/23 1019 (!) 115     Resp 06/03/23 1019 16     Temp 06/03/23 1019 99.9 F (37.7 C)     Temp  Source 06/03/23 1019 Oral     SpO2 06/03/23 1019 98 %     Weight --      Height --      Head Circumference --      Peak Flow --      Pain Score 06/03/23 1023 4     Pain Loc --      Pain Education --      Exclude from Growth Chart --    No data found.  Updated Vital Signs BP 139/77   Pulse (!) 115   Temp 99.9 F (37.7 C) (Oral)   Resp 16   LMP 05/17/2023 (Approximate)   SpO2 98%      Physical Exam Constitutional:      General: She is not in acute distress.    Appearance: She is well-developed. She is obese. She is ill-appearing.  HENT:     Head: Normocephalic and atraumatic.     Right Ear: There is impacted cerumen.     Left Ear: There is impacted cerumen.     Nose: Congestion and rhinorrhea present.     Mouth/Throat:     Pharynx: No posterior oropharyngeal erythema.  Eyes:     Conjunctiva/sclera: Conjunctivae normal.     Pupils: Pupils are equal, round, and reactive to light.  Cardiovascular:     Rate and Rhythm: Regular rhythm. Tachycardia present.     Heart sounds: Normal heart sounds.  Pulmonary:     Effort: Pulmonary effort is normal. No respiratory distress.     Breath sounds: Normal breath sounds.  Abdominal:     General: There is no distension.     Palpations: Abdomen is soft.  Musculoskeletal:        General: Normal range of motion.     Cervical back: Normal range of motion.  Skin:    General: Skin is warm and dry.  Neurological:     Mental Status: She is alert.      UC Treatments / Results  Labs (all labs ordered are listed, but only abnormal results are displayed) Labs Reviewed  POC SARS CORONAVIRUS 2 AG -  ED  POCT INFLUENZA A/B    EKG   Radiology No results found.  Procedures Procedures (including critical care time)  Medications Ordered in UC Medications - No data  to display  Initial Impression / Assessment and Plan / UC Course  I have reviewed the triage vital signs and the nursing notes.  Pertinent labs & imaging results  that were available during my care of the patient were reviewed by me and considered in my medical decision making (see chart for details).     Final Clinical Impressions(s) / UC Diagnoses   Final diagnoses:  Influenza-like symptoms     Discharge Instructions      Continue with rest, fluids, over-the-counter cough and cold medicines I am prescribing Phenergan DM for the cough I have given a written prescription for doxycycline for you to fill if you fail to improve over the next couple of days     ED Prescriptions     Medication Sig Dispense Auth. Provider   doxycycline (VIBRAMYCIN) 100 MG capsule Take 1 capsule (100 mg total) by mouth 2 (two) times daily. 14 capsule Eustace Moore, MD   promethazine-dextromethorphan (PROMETHAZINE-DM) 6.25-15 MG/5ML syrup Take 5 mLs by mouth 4 (four) times daily as needed for cough. 118 mL Eustace Moore, MD      PDMP not reviewed this encounter.   Eustace Moore, MD 06/03/23 1100

## 2023-06-03 NOTE — Discharge Instructions (Signed)
Continue with rest, fluids, over-the-counter cough and cold medicines I am prescribing Phenergan DM for the cough I have given a written prescription for doxycycline for you to fill if you fail to improve over the next couple of days

## 2023-06-23 ENCOUNTER — Ambulatory Visit
Admission: EM | Admit: 2023-06-23 | Discharge: 2023-06-23 | Disposition: A | Discharge status: Home / Self Care | Payer: BC Managed Care – PPO | Attending: Family Medicine | Admitting: Family Medicine

## 2023-06-23 ENCOUNTER — Other Ambulatory Visit: Payer: Self-pay

## 2023-06-23 DIAGNOSIS — H6123 Impacted cerumen, bilateral: Secondary | ICD-10-CM

## 2023-06-23 DIAGNOSIS — H6691 Otitis media, unspecified, right ear: Secondary | ICD-10-CM | POA: Diagnosis not present

## 2023-06-23 MED ORDER — AMOXICILLIN-POT CLAVULANATE 875-125 MG PO TABS: 1.0000 | ORAL_TABLET | Freq: Two times a day (BID) | ORAL | 0 refills | Status: AC

## 2023-06-23 NOTE — ED Triage Notes (Addendum)
 On Saturday started with right ear pain while swallowing, now has a lot of pressure and feels full/achy. Had flu 2 weeks ago. Used natural ear drops which did not help. Has been taking ibuprofen  which helps some.

## 2023-06-23 NOTE — Discharge Instructions (Addendum)
 Advised patient to take medication as directed with food to completion.  Encouraged to increase daily water intake to 64 ounces per day while taking this medication.  Advised may use OTC Debrox 4 drops twice daily and to left EAC to remove remaining earwax.  Advised if symptoms worsen and/or unresolved please follow-up with PCP, ENT, or here for further evaluation.

## 2023-06-23 NOTE — ED Provider Notes (Signed)
 [facility]    CSN: [id number] Arrival date & time: 06/23/23  1528      History   Chief Complaint Chief Complaint  Patient presents with   Ear Fullness    HPI Sarah Simmons is a 44 y.o. female.   HPI 43 year old female presents with right ear pain for 4 days.  Reports has pressure and feels achy.  PMH significant for acoustic neuroma syndrome, obesity, and vitamin D  deficiency.  Past Medical History:  Diagnosis Date   Acoustic neuroma (HCC)    Anemia    Angio-edema    Nephrolithiasis    Papanicolaou smear of cervix with low risk human papillomavirus (HPV) DNA test positive 12/01/2017   Repeat Pap June 2020   Urticaria     Patient Active Problem List   Diagnosis Date Noted   Vaginal candidiasis 04/06/2023   Ganglion cyst of right foot 06/19/2022   High grade squamous intraepithelial lesion (HGSIL), grade 3 CIN, on biopsy of cervix 12/17/2021   Class 1 obesity due to excess calories without serious comorbidity in adult 11/29/2017   Umbilical hernia without obstruction and without gangrene 11/29/2017   Vitamin D  deficiency 11/29/2017   Encounter for post Essure sterilization check 11/29/2017   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 Authorizing Provider  amoxicillin -clavulanate (AUGMENTIN ) 875-125 MG tablet Take 1 tablet by mouth 2 (two) times daily for 10 days. 06/23/23 07/03/23  Yes Leonides Ramp, FNP  cetirizine  (ZYRTEC ) 10 MG tablet TAKE 1 TABLET BY MOUTH EVERY DAY 06/24/20   Cherre Cornish, NP  EPINEPHrine  (EPIPEN  2-PAK) 0.3 mg/0.3 mL IJ SOAJ injection Inject 0.3 mg into the muscle as needed for anaphylaxis. 08/08/21   Cherre Cornish, NP  Multiple Vitamin (MULTIVITAMIN) tablet Take 1 tablet by mouth daily.    [provider]  VITAMIN D  PO Take 4,000 Units by mouth daily.    [provider]    Family History Family History  Problem Relation Age of Onset   Transient ischemic attack Mother    Stroke Mother    Allergic rhinitis Mother    Heart failure Mother    Liver cancer Father    Cancer Father        testicular, Liver   Allergic rhinitis Father    Uterine cancer Sister        GTD   Allergic rhinitis Sister    Hypothyroidism Maternal Grandmother    Transient ischemic attack Maternal Grandfather    Transient ischemic attack Paternal Grandfather    Transient ischemic attack Other        grandparents  Breast cancer Neg Hx     Social History Social History   Tobacco Use   Smoking status: Never   Smokeless tobacco: Never  Vaping Use   Vaping status: Never Used  Substance Use Topics   Alcohol use: Yes    Alcohol/week: 0.0 standard drinks of alcohol    Comment: rarely   Drug use: No     Allergies   Almond oil, Chocolate, and Azithromycin    Review of Systems Review of Systems  HENT:  Positive for ear pain.   All other systems reviewed and are negative.    Physical Exam Triage Vital Signs ED Triage Vitals  Encounter Vitals Group     BP 06/23/23 1538 134/81     Systolic BP Percentile --      Diastolic BP Percentile --      Pulse Rate 06/23/23 1538 89     Resp 06/23/23 1538 16     Temp 06/23/23 1538 98.4 F (36.9 C)     Temp Source 06/23/23 1538 Oral     SpO2 06/23/23 1538 98 %     Weight --      Height --      Head Circumference --      Peak Flow --      Pain Score 06/23/23 1541 3     Pain Loc --      Pain  Education --      Exclude from Growth Chart --    No data found.  Updated Vital Signs BP 134/81   Pulse 89   Temp 98.4 F (36.9 C) (Oral)   Resp 16   LMP 05/17/2023 (Approximate)   SpO2 98%    Physical Exam Vitals and nursing note reviewed.  Constitutional:      Appearance: Normal appearance. She is normal weight.  HENT:     Head: Normocephalic and atraumatic.     Right Ear: External ear normal.     Left Ear: External ear normal.     Ears:     Comments: Bilateral EACs occluded with excessive cerumen unable to visualize either TM.  Post bilateral ear lavage: Right EAC-clear, right TM-red rimmed, erythematous; left EAC-occluded with excessive cerumen unable to visualize left TM    Mouth/Throat:     Mouth: Mucous membranes are moist.     Pharynx: Oropharynx is clear.  Eyes:     Extraocular Movements: Extraocular movements intact.     Conjunctiva/sclera: Conjunctivae normal.     Pupils: Pupils are equal, round, and reactive to light.  Cardiovascular:     Rate and Rhythm: Normal rate and regular rhythm.     Pulses: Normal pulses.     Heart sounds: Normal heart sounds.  Pulmonary:     Effort: Pulmonary effort is normal.     Breath sounds: Normal breath sounds. No wheezing, rhonchi or rales.  Musculoskeletal:        General: Normal range of motion.     Cervical back: Normal range of motion and neck supple.  Skin:    General: Skin is warm and dry.  Neurological:     General: No focal deficit present.     Mental Status: She is alert and oriented to person, place, and time. Mental status is at baseline.  Psychiatric:        Mood and Affect: Mood normal.        Behavior: Behavior normal.      UC Treatments / Results  Labs (all labs ordered are listed, but  only abnormal results are displayed) Labs Reviewed - No data to display  EKG   Radiology No results found.  Procedures Procedures (including critical care time)  Medications Ordered in UC Medications - No  data to display  Initial Impression / Assessment and Plan / UC Course  I have reviewed the triage vital signs and the nursing notes.  Pertinent labs & imaging results that were available during my care of the patient were reviewed by me and considered in my medical decision making (see chart for details).     MDM: 1.  Acute right otitis media-Rx'd Augmentin  875/125 mg tablet: Take 1 tablet twice daily x 10 days; 2.  Bilateral impacted cerumen-right EAC successful with cerumen removal, left EAC still occluded-Advised may use OTC Debrox 4 drops twice daily and to left EAC to remove remaining earwax.  Advised if symptoms worsen and/or unresolved please follow-up with PCP, ENT, or here for further evaluation. Advised patient to take medication as directed with food to completion.  Encouraged to increase daily water intake to 64 ounces per day while taking this medication.  Patient discharged home, hemodynamically stable. Final Clinical Impressions(s) / UC Diagnoses   Final diagnoses:  Bilateral impacted cerumen  Acute right otitis media     Discharge Instructions      Advised patient to take medication as directed with food to completion.  Encouraged to increase daily water intake to 64 ounces per day while taking this medication.  Advised may use OTC Debrox 4 drops twice daily and to left EAC to remove remaining earwax.  Advised if symptoms worsen and/or unresolved please follow-up with PCP, ENT, or here for further evaluation.     ED Prescriptions     Medication Sig Dispense Auth. Provider   amoxicillin -clavulanate (AUGMENTIN ) 875-125 MG tablet Take 1 tablet by mouth 2 (two) times daily for 10 days. 20 tablet Bindu Docter, FNP      PDMP not reviewed this encounter.   Leonides Ramp, FNP 06/23/23 1655

## 2023-06-29 ENCOUNTER — Telehealth: Payer: Self-pay

## 2023-06-29 MED ORDER — FLUCONAZOLE 200 MG PO TABS: ORAL_TABLET | ORAL | 0 refills | Status: DC

## 2023-08-05 ENCOUNTER — Telehealth: Payer: Self-pay | Admitting: *Deleted

## 2023-08-05 NOTE — Telephone Encounter (Signed)
 Lmovm to verify card hx.

## 2023-08-08 DIAGNOSIS — R55 Syncope and collapse: Secondary | ICD-10-CM | POA: Insufficient documentation

## 2023-08-08 NOTE — Progress Notes (Unsigned)
 Cardiology Office Note  Date:  08/09/2023   ID:  Sarah Simmons, DOB 08/31/1980, MRN [id number]  PCP:  Sarah Butter, NP   Chief Complaint  Patient presents with   New Patient (Initial Visit)    Patient having pre-syncopal spells and palpitations.     HPI:  Sarah Simmons is a 43 year old woman with past medical history of Acoustic neuroma, 2014, surgery Who presents by referral from [facility] for consultation of her palpitations, near-syncope  Reports episode in December where she was sitting, appreciated lightheadedness May 30, 2023 started feeling lightheaded at church Seen in the ER, cardiac workup negative  Seen in urgent care June 03, 2023 for URI, reported being sick 4 to 5 days Cough congestion postnasal drip fatigue vomiting Corona and flu negative  Seen in the ER June 23, 2023 ear fullness Reports that she eventually tested positive for COVID  Has had periodic palpitations, short spells, can cough and they resolve  EKG personally reviewed by myself on todays visit EKG Interpretation Date/Time:  Monday August 09 2023 15:48:40 EST Ventricular Rate:  79 PR Interval:  154 QRS Duration:  86 QT Interval:  370 QTC Calculation: 424 R Axis:   59  Text Interpretation: Normal sinus rhythm Normal ECG When compared with ECG of 30-May-2023 13:35, Nonspecific T wave abnormality no longer evident in Inferior leads Nonspecific T wave abnormality, improved in Anterolateral leads Confirmed by Julien Nordmann [phone]) on 08/09/2023 4:11:20 PM      PMH:   has a past medical history of Acoustic neuroma (HCC), Anemia, Angio-edema, Nephrolithiasis, Papanicolaou smear of cervix with low risk human papillomavirus (HPV) DNA test positive (12/01/2017), and Urticaria.  PSH:    Past Surgical History:  Procedure Laterality Date   ACOUSTIC NEUROMA RESECTION  12/14/2012   neuroma removal   CHOLECYSTECTOMY  2010   ESSURE TUBAL LIGATION     EYE SURGERY     x3    Current Outpatient Medications   Medication Sig Dispense Refill   cetirizine (ZYRTEC) 10 MG tablet TAKE 1 TABLET BY MOUTH EVERY DAY 90 tablet 1   fluconazole (DIFLUCAN) 200 MG tablet Take one today and repeat once as needed every 3 days if symptoms don't improve. 7 tablet 0   Multiple Vitamin (MULTIVITAMIN) tablet Take 1 tablet by mouth daily.     VITAMIN D PO Take 4,000 Units by mouth daily.     EPINEPHrine (EPIPEN 2-PAK) 0.3 mg/0.3 mL IJ SOAJ injection Inject 0.3 mg into the muscle as needed for anaphylaxis. (Patient not taking: Reported on 08/09/2023) 1 each 1   No current facility-administered medications for this visit.     Allergies:   Almond oil, Chocolate, and Azithromycin   Social History:  The patient  reports that she has never smoked. She has never used smokeless tobacco. She reports current alcohol use. She reports that she does not use drugs.   Family History:   family history includes Allergic rhinitis in her father, mother, and sister; Cancer in her father; Heart failure in her mother; Hypothyroidism in her maternal grandmother; Liver cancer in her father; Stroke in her mother; Transient ischemic attack in her maternal grandfather, mother, paternal grandfather, and another family member; Uterine cancer in her sister.    Review of Systems: Review of Systems  Constitutional: Negative.   HENT: Negative.    Respiratory: Negative.    Cardiovascular: Negative.   Gastrointestinal: Negative.   Musculoskeletal: Negative.   Neurological: Negative.   Psychiatric/Behavioral: Negative.    All other systems reviewed  and are negative.    PHYSICAL EXAM: VS:  BP 100/70 (BP Location: Right Arm, Patient Position: Sitting, Cuff Size: Large)   Pulse 79   Ht 5\' 9"  (1.753 m)   Wt 230 lb 2 oz (104.4 kg)   SpO2 98%   BMI 33.98 kg/m  , BMI Body mass index is 33.98 kg/m. GEN: Well nourished, well developed, in no acute distress HEENT: normal Neck: no JVD, carotid bruits, or masses Cardiac: RRR; no murmurs, rubs, or  gallops,no edema  Respiratory:  clear to auscultation bilaterally, normal work of breathing GI: soft, nontender, nondistended, + BS MS: no deformity or atrophy Skin: warm and dry, no rash Neuro:  Strength and sensation are intact Psych: euthymic mood, full affect   Recent Labs: 05/30/2023: BUN 13; Creatinine, Ser 0.92; Hemoglobin 13.5; Platelets 373; Potassium 3.7; Sodium 136    Lipid Panel Lab Results  Component Value Date   CHOL 205 (H) 08/08/2021   HDL 56 08/08/2021   LDLCALC 122 (H) 08/08/2021   TRIG 153 (H) 08/08/2021      Wt Readings from Last 3 Encounters:  08/09/23 230 lb 2 oz (104.4 kg)  05/30/23 220 lb (99.8 kg)  04/06/23 236 lb 8 oz (107.3 kg)       ASSESSMENT AND PLAN:  Problem List Items Addressed This Visit       Cardiology Problems   Near syncope - Primary   Relevant Orders   EKG 12-Lead (Completed)   Paroxysmal tachycardia/palpitations Reports rare brief episodes, sometimes resolves with coughing Unable to exclude PACs, PVCs Short runs of atrial tachycardia/SVT Zio monitor for 2 weeks ordered for further evaluation  Lightheaded Spell while sitting in church December 2024 Etiology unclear, blood pressure low but stable Zio monitor as above Normal EKG  Hyperlipidemia We have encouraged continued exercise, careful diet management   Patient seen in consultation for Dr.Quale and Sarah Simmons and will be referred back to their office for ongoing care of the issues detailed above  Signed, Dossie Arbour, M.D., Ph.D. [facility] [place], [place] [phone]

## 2023-08-09 ENCOUNTER — Ambulatory Visit

## 2023-08-09 ENCOUNTER — Encounter: Payer: Self-pay | Admitting: Cardiovascular Disease

## 2023-08-09 ENCOUNTER — Ambulatory Visit: Payer: BC Managed Care – PPO | Attending: Cardiovascular Disease | Admitting: Cardiovascular Disease

## 2023-08-09 VITALS — BP 100/70 | HR 79 | Ht 69.0 in | Wt 230.1 lb

## 2023-08-09 DIAGNOSIS — R55 Syncope and collapse: Secondary | ICD-10-CM

## 2023-08-09 DIAGNOSIS — R002 Palpitations: Secondary | ICD-10-CM

## 2023-08-09 DIAGNOSIS — R000 Tachycardia, unspecified: Secondary | ICD-10-CM

## 2023-08-09 NOTE — Patient Instructions (Addendum)
Medication Instructions:  No changes  If you need a refill on your cardiac medications before your next appointment, please call your pharmacy.   Lab work: No new labs needed  Testing/Procedures:  ZIO XT- Long Term Monitor Instructions  Your physician has requested you wear a ZIO patch monitor for 14 days.  This is a single patch monitor. [employer] supplies one patch monitor per enrollment. Additional stickers are not available. Please do not apply patch if you will be having a Nuclear Stress Test,  Echocardiogram, Cardiac CT, MRI, or Chest Xray during the period you would be wearing the  monitor. The patch cannot be worn during these tests. You cannot remove and re-apply the  ZIO XT patch monitor.  Your ZIO patch monitor will be mailed 3 day USPS to your address on file. It may take 3-5 days  to receive your monitor after you have been enrolled.  Once you have received your monitor, please review the enclosed instructions. Your monitor  has already been registered assigning a specific monitor serial # to you.  Billing and Patient Assistance Program Information  We have supplied [employer] with any of your insurance information on file for billing purposes. [employer] offers a sliding scale Patient Assistance Program for patients that do not have  insurance, or whose insurance does not completely cover the cost of the ZIO monitor.  You must apply for the Patient Assistance Program to qualify for this discounted rate.  To apply, please call [employer] at [phone], select option 4, select option 2, ask to apply for  Patient Assistance Program. Meredeth Ide will ask your household income, and how many people  are in your household. They will quote your out-of-pocket cost based on that information.  [employer] will also be able to set up a 31-month, interest-free payment plan if needed.  Applying the monitor   Shave hair from upper left chest.  Hold abrader disc by orange tab. Rub abrader in 40  strokes over the upper left chest as  indicated in your monitor instructions.  Clean area with 4 enclosed alcohol pads. Let dry.  Apply patch as indicated in monitor instructions. Patch will be placed under collarbone on left  side of chest with arrow pointing upward.  Rub patch adhesive wings for 2 minutes. Remove white label marked "1". Remove the white  label marked "2". Rub patch adhesive wings for 2 additional minutes.  While looking in a mirror, press and release button in center of patch. A small green light will  flash 3-4 times. This will be your only indicator that the monitor has been turned on.  Do not shower for the first 24 hours. You may shower after the first 24 hours.  Press the button if you feel a symptom. You will hear a small click. Record Date, Time and  Symptom in the Patient Logbook.  When you are ready to remove the patch, follow instructions on the last 2 pages of Patient  Logbook. Stick patch monitor onto the last page of Patient Logbook.  Place Patient Logbook in the blue and white box. Use locking tab on box and tape box closed  securely. The blue and white box has prepaid postage on it. Please place it in the mailbox as  soon as possible. Your physician should have your test results approximately 7 days after the  monitor has been mailed back to [facility].  Call [facility] Customer Care at [phone] if you have questions regarding  your ZIO XT patch  monitor. Call them immediately if you see an orange light blinking on your  monitor.  If your monitor falls off in less than 4 days, contact our Monitor department at [phone].  If your monitor becomes loose or falls off after 4 days call [employer] at [phone] for  suggestions on securing your monitor   Follow-Up: At [facility], you and your health needs are our priority.  As part of our continuing mission to provide you with exceptional heart care, we have created designated Provider Care  Teams.  These Care Teams include your primary Cardiologist (physician) and Advanced Practice Providers (APPs -  Physician Assistants and Nurse Practitioners) who all work together to provide you with the care you need, when you need it.  You will need a follow up appointment as needed  Providers on your designated Care Team:   Murray Hodgkins, NP Christell Faith, PA-C Cadence Kathlen Mody, [place]  COVID-19 Vaccine Information can be found at: [email or web address] For questions related to vaccine distribution or appointments, please email vaccine@ [email or web address] or call [phone].

## 2023-09-19 ENCOUNTER — Encounter: Payer: Self-pay | Admitting: Cardiovascular Disease

## 2023-09-19 DIAGNOSIS — R002 Palpitations: Secondary | ICD-10-CM

## 2023-09-19 DIAGNOSIS — R000 Tachycardia, unspecified: Secondary | ICD-10-CM

## 2023-09-19 DIAGNOSIS — R55 Syncope and collapse: Secondary | ICD-10-CM

## 2023-11-11 ENCOUNTER — Encounter: Payer: Self-pay | Admitting: Medical-Surgical

## 2023-11-11 ENCOUNTER — Ambulatory Visit (INDEPENDENT_AMBULATORY_CARE_PROVIDER_SITE_OTHER): Admitting: Medical-Surgical

## 2023-11-11 VITALS — BP 95/67 | HR 89 | Resp 20 | Ht 69.0 in | Wt 217.1 lb

## 2023-11-11 DIAGNOSIS — Z1231 Encounter for screening mammogram for malignant neoplasm of breast: Secondary | ICD-10-CM | POA: Diagnosis not present

## 2023-11-11 DIAGNOSIS — Z0000 Encounter for general adult medical examination without abnormal findings: Secondary | ICD-10-CM | POA: Diagnosis not present

## 2023-11-11 DIAGNOSIS — E559 Vitamin D deficiency, unspecified: Secondary | ICD-10-CM | POA: Diagnosis not present

## 2023-11-11 DIAGNOSIS — D069 Carcinoma in situ of cervix, unspecified: Secondary | ICD-10-CM

## 2023-11-11 DIAGNOSIS — E782 Mixed hyperlipidemia: Secondary | ICD-10-CM

## 2023-11-11 DIAGNOSIS — H6122 Impacted cerumen, left ear: Secondary | ICD-10-CM

## 2023-11-11 MED ORDER — EPINEPHRINE 0.3 MG/0.3ML IJ SOAJ: 0.3000 mg | INTRAMUSCULAR | 1 refills | Status: AC | PRN

## 2023-11-11 NOTE — Progress Notes (Signed)
 Complete physical exam  Patient: Sarah Simmons   DOB: 01-Dec-1980   43 y.o. Female  MRN: [id number]  Subjective:     Chief Complaint  Patient presents with   Annual Exam   Ear Pain    LEFT    Sarah Simmons is a 43 y.o. female who presents today for a complete physical exam. She reports consuming a general diet. Walking for exercise.  She generally feels well. She reports sleeping well. She does not have additional problems to discuss today.    Most recent fall risk assessment:    04/09/2015    2:42 PM  Fall Risk   Falls in the past year? Yes  Number falls in past yr: 1  Injury with Fall? 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   VITAMIN D  PO Take 4,000 Units by mouth daily.   [DISCONTINUED] EPINEPHrine  (EPIPEN  2-PAK) 0.3 mg/0.3 mL IJ SOAJ injection Inject 0.3 mg into the muscle as needed for anaphylaxis.   [DISCONTINUED] fluconazole  (DIFLUCAN ) 200 MG tablet Take one today and repeat once as needed every 3 days if symptoms don't improve. (Patient not taking: Reported on 11/11/2023)   No facility-administered medications prior to visit.    Review of Systems  Constitutional:  Negative for chills, fever, malaise/fatigue and weight loss.  HENT:  Positive for ear pain (left). Negative for congestion, hearing  loss, sinus pain and sore throat.   Eyes:  Negative for blurred vision, photophobia and pain.  Respiratory:  Negative for cough, shortness of breath and wheezing.   Cardiovascular:  Negative for chest pain, palpitations and leg swelling.  Gastrointestinal:  Negative for abdominal pain, constipation, diarrhea, heartburn, nausea and vomiting.  Genitourinary:  Negative for dysuria, frequency and urgency.  Musculoskeletal:  Negative for falls and neck pain.  Skin:  Negative for itching and rash.  Neurological:  Negative for dizziness, weakness and headaches.  Endo/Heme/Allergies:  Negative for polydipsia. Does not bruise/bleed easily.  Psychiatric/Behavioral:  Negative for depression, substance abuse and suicidal ideas. The patient is not nervous/anxious and does not have insomnia.      Objective:    BP 95/67 (BP Location: Left Arm, Cuff Size: Normal)   Pulse 89   Resp 20   Ht 5\' 9"  (1.753 m)   Wt 217 lb 1.3 oz (98.5 kg)   SpO2 98%   BMI 32.06 kg/m    Physical Exam Vitals reviewed.  Constitutional:      General: She is not in acute distress.    Appearance: Normal appearance. She is not ill-appearing.  HENT:     Head: Normocephalic and atraumatic.     Right Ear: Tympanic membrane, ear canal and external ear normal. There is no  impacted cerumen.     Left Ear: External ear normal. There is impacted cerumen.     Nose: Nose normal. No congestion or rhinorrhea.     Mouth/Throat:     Mouth: Mucous membranes are moist.     Pharynx: No oropharyngeal exudate or posterior oropharyngeal erythema.  Eyes:     General: No scleral icterus.       Right eye: No discharge.        Left eye: No discharge.     Extraocular Movements: Extraocular movements intact.     Conjunctiva/sclera: Conjunctivae normal.     Pupils: Pupils are equal, round, and reactive to light.  Neck:     Thyroid: No thyromegaly.     Vascular: No carotid bruit or JVD.     Trachea: Trachea normal.  Cardiovascular:     Rate  and Rhythm: Normal rate and regular rhythm.     Pulses: Normal pulses.     Heart sounds: Normal heart sounds. No murmur heard.    No friction rub. No gallop.  Pulmonary:     Effort: Pulmonary effort is normal. No respiratory distress.     Breath sounds: Normal breath sounds. No wheezing.  Abdominal:     General: Bowel sounds are normal. There is no distension.     Palpations: Abdomen is soft.     Tenderness: There is no abdominal tenderness. There is no guarding.  Musculoskeletal:        General: Normal range of motion.     Cervical back: Normal range of motion and neck supple.  Lymphadenopathy:     Cervical: No cervical adenopathy.  Skin:    General: Skin is warm and dry.  Neurological:     Mental Status: She is alert and oriented to person, place, and time.     Cranial Nerves: No cranial nerve deficit.  Psychiatric:        Mood and Affect: Mood normal.        Behavior: Behavior normal.        Thought Content: Thought content normal.        Judgment: Judgment normal.      No results found for any visits on 11/11/23.     Assessment & Plan:    Routine Health Maintenance and Physical Exam  Immunization History  Administered Date(s) Administered   Influenza,inj,Quad PF,6+ Mos 04/19/2019, 06/15/2022   PPD Test 03/18/2020   Tdap 11/12/2008, 04/19/2019    Health Maintenance  Topic Date Due   COVID-19 Vaccine (1 - 2024-25 season) 11/27/2023 (Originally 02/07/2023)   Hepatitis C Screening  11/10/2024 (Originally 08/19/1998)   HIV Screening  11/10/2024 (Originally 08/19/1995)   INFLUENZA VACCINE  01/07/2024   Cervical Cancer Screening (HPV/Pap Cotest)  09/04/2026   DTaP/Tdap/Td (3 - Td or Tdap) 04/18/2029   HPV VACCINES  Aged Out   Meningococcal B Vaccine  Aged Out    Discussed health benefits of physical activity, and encouraged her to engage in regular exercise appropriate for her age and condition.  1. Annual physical exam (Primary) Checking labs as below. UTD on  preventative care. Wellness information provided with AVS. - Lipid panel - CBC with Differential/Platelet - CMP14+EGFR  2. Vitamin D  deficiency Checking vitamin D . Continue daily supplementation.  - VITAMIN D  25 Hydroxy (Vit-D Deficiency, Fractures)  3. Moderate mixed hyperlipidemia not requiring statin therapy Checking lipids. - Lipid panel  4. Encounter for screening mammogram for malignant neoplasm of breast Mammogram ordered.  - MM DIGITAL SCREENING BILATERAL; Future  5. High grade squamous intraepithelial lesion (HGSIL), grade 3 CIN, on biopsy of cervix Would like to see a different provider. Referring to OBGYN. - Ambulatory referral to Obstetrics / Gynecology  6. Cerumen Impaction Indication: Cerumen impaction of the ear(s)  Medical necessity statement: On physical examination, cerumen impairs clinically significant portions of the external auditory canal, and tympanic membrane. Noted obstructive, copious cerumen that cannot be removed without magnification and instrumentations  Consent: Discussed benefits and risks of procedure and verbal consent obtained Procedure: Patient was prepped for the procedure. Utilized an otoscope to assess and take note of the ear canal, the tympanic membrane, and the presence, amount, and placement of the cerumen. Gentle water irrigation and soft plastic curette was utilized to remove cerumen.  Post procedure examination: shows cerumen was completely removed. Patient tolerated procedure well. The patient is made aware that they may experience temporary vertigo, temporary hearing loss, and temporary discomfort. If these symptom last for more than 24 hours to call the clinic or proceed to the ED.  Return in about 1 year (around 11/10/2024) for annual physical exam or sooner if needed .   Nylen Creque, NP

## 2023-11-12 ENCOUNTER — Ambulatory Visit: Payer: Self-pay | Admitting: Medical-Surgical

## 2023-11-12 LAB — CBC WITH DIFFERENTIAL/PLATELET
Basophils Absolute: 0.1 10*3/uL (ref 0.0–0.2)
Basos: 1 %
EOS (ABSOLUTE): 0.4 10*3/uL (ref 0.0–0.4)
Eos: 3 %
Hematocrit: 47.1 % — ABNORMAL HIGH (ref 34.0–46.6)
Hemoglobin: 15 g/dL (ref 11.1–15.9)
Immature Grans (Abs): 0 10*3/uL (ref 0.0–0.1)
Immature Granulocytes: 0 %
Lymphocytes Absolute: 3.1 10*3/uL (ref 0.7–3.1)
Lymphs: 29 %
MCH: 26.5 pg — ABNORMAL LOW (ref 26.6–33.0)
MCHC: 31.8 g/dL (ref 31.5–35.7)
MCV: 83 fL (ref 79–97)
Monocytes Absolute: 0.7 10*3/uL (ref 0.1–0.9)
Monocytes: 7 %
Neutrophils Absolute: 6.2 10*3/uL (ref 1.4–7.0)
Neutrophils: 60 %
Platelets: 405 10*3/uL (ref 150–450)
RBC: 5.67 x10E6/uL — ABNORMAL HIGH (ref 3.77–5.28)
RDW: 13.2 % (ref 11.7–15.4)
WBC: 10.4 10*3/uL (ref 3.4–10.8)

## 2023-11-12 LAB — CMP14+EGFR
ALT: 41 IU/L — ABNORMAL HIGH (ref 0–32)
AST: 22 IU/L (ref 0–40)
Albumin: 4.2 g/dL (ref 3.9–4.9)
Alkaline Phosphatase: 132 IU/L — ABNORMAL HIGH (ref 44–121)
BUN/Creatinine Ratio: 17 (ref 9–23)
BUN: 14 mg/dL (ref 6–24)
Bilirubin Total: 0.7 mg/dL (ref 0.0–1.2)
CO2: 20 mmol/L (ref 20–29)
Calcium: 9.3 mg/dL (ref 8.7–10.2)
Chloride: 103 mmol/L (ref 96–106)
Creatinine, Ser: 0.82 mg/dL (ref 0.57–1.00)
Globulin, Total: 2.9 g/dL (ref 1.5–4.5)
Glucose: 83 mg/dL (ref 70–99)
Potassium: 4.9 mmol/L (ref 3.5–5.2)
Sodium: 138 mmol/L (ref 134–144)
Total Protein: 7.1 g/dL (ref 6.0–8.5)
eGFR: 91 mL/min/{1.73_m2} (ref >59)

## 2023-11-12 LAB — LIPID PANEL
Chol/HDL Ratio: 4.5 ratio — ABNORMAL HIGH (ref 0.0–4.4)
Cholesterol, Total: 219 mg/dL — ABNORMAL HIGH (ref 100–199)
HDL: 49 mg/dL (ref >39)
LDL Chol Calc (NIH): 141 mg/dL — ABNORMAL HIGH (ref 0–99)
Triglycerides: 161 mg/dL — ABNORMAL HIGH (ref 0–149)
VLDL Cholesterol Cal: 29 mg/dL (ref 5–40)

## 2023-11-12 LAB — VITAMIN D 25 HYDROXY (VIT D DEFICIENCY, FRACTURES): Vit D, 25-Hydroxy: 35.4 ng/mL (ref 30.0–100.0)

## 2023-12-02 ENCOUNTER — Ambulatory Visit

## 2024-02-08 ENCOUNTER — Encounter: Payer: Self-pay | Admitting: Sports Medicine

## 2024-02-09 ENCOUNTER — Encounter: Payer: Self-pay | Admitting: Medical-Surgical

## 2024-02-11 ENCOUNTER — Other Ambulatory Visit: Payer: Self-pay | Admitting: Medical-Surgical

## 2024-02-11 DIAGNOSIS — Z1231 Encounter for screening mammogram for malignant neoplasm of breast: Secondary | ICD-10-CM

## 2024-02-18 ENCOUNTER — Ambulatory Visit

## 2024-02-28 ENCOUNTER — Ambulatory Visit
Admission: RE | Admit: 2024-02-28 | Discharge: 2024-02-28 | Disposition: A | Discharge status: Home / Self Care | Source: Ambulatory Visit | Attending: Medical-Surgical

## 2024-02-28 DIAGNOSIS — Z1231 Encounter for screening mammogram for malignant neoplasm of breast: Secondary | ICD-10-CM

## 2024-02-29 ENCOUNTER — Other Ambulatory Visit (HOSPITAL_COMMUNITY)
Admission: RE | Admit: 2024-02-29 | Discharge: 2024-02-29 | Disposition: A | Discharge status: Home / Self Care | Source: Ambulatory Visit | Attending: Obstetrics and Gynecology | Admitting: Obstetrics and Gynecology

## 2024-02-29 ENCOUNTER — Ambulatory Visit: Admitting: Obstetrics and Gynecology

## 2024-02-29 ENCOUNTER — Encounter: Payer: Self-pay | Admitting: Obstetrics and Gynecology

## 2024-02-29 VITALS — BP 112/77 | HR 86 | Ht 68.0 in | Wt 212.0 lb

## 2024-02-29 DIAGNOSIS — Z01419 Encounter for gynecological examination (general) (routine) without abnormal findings: Secondary | ICD-10-CM | POA: Diagnosis not present

## 2024-02-29 NOTE — Progress Notes (Addendum)
 GYNECOLOGY ANNUAL PREVENTATIVE CARE ENCOUNTER NOTE  History:     Sarah Simmons is a 43 y.o. [phone] female here for a routine annual gynecologic exam. Current complaints: irregular menstrual cycles.   Denies abnormal unusual vaginal bleeding, discharge, pelvic pain, problems with intercourse or other gynecologic concerns.  Gynecologic History Patient's last menstrual period was 02/24/2024 (approximate). Contraception: tubal ligation Last Pap: 09/03/2021. 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 Had colpo/LEEP in July 2023. Last Mammogram: 02/28/2024.  Results pending.  Last Colonoscopy: N/A; denies concerns  Obstetric History OB History  Gravida Para Term Preterm AB Living  3 3 2 1  3   SAB IAB Ectopic Multiple Live Births      3    # Outcome Date GA Lbr Len/2nd Weight Sex Type Anes PTL Lv  3 Preterm     M Vag-Spont   LIV  2 Term     F Vag-Spont   LIV  1 Term     F Vag-Spont   LIV    Past Medical History:  Diagnosis Date   Acoustic neuroma (HCC)    Anemia    Angio-edema    Nephrolithiasis    Papanicolaou smear of cervix with low risk human papillomavirus (HPV) DNA test positive 12/01/2017   Repeat Pap June 2020   Urticaria     Past Surgical History:  Procedure Laterality Date   ACOUSTIC NEUROMA RESECTION  12/14/2012   neuroma removal   CHOLECYSTECTOMY  2010   ESSURE TUBAL LIGATION     EYE SURGERY     x3    Current Outpatient Medications on File Prior to Visit  Medication Sig Dispense Refill   cetirizine  (ZYRTEC ) 10 MG tablet TAKE 1 TABLET BY MOUTH EVERY DAY 90 tablet 1   EPINEPHrine  (EPIPEN  2-PAK) 0.3 mg/0.3 mL IJ SOAJ injection Inject 0.3 mg into the muscle as needed for anaphylaxis. 1 each 1   Multiple Vitamin (MULTIVITAMIN) tablet Take 1 tablet by mouth daily.     VITAMIN D  PO Take 4,000 Units by mouth daily.     No current facility-administered medications on file prior to visit.    Allergies  Allergen Reactions   Almond Oil Itching and Other  (See Comments)    Flushing    Chocolate Hives and Itching    If she eats too much   Azithromycin  Diarrhea and Other (See Comments)   Doxycycline  Diarrhea and Nausea And Vomiting    Social History:  reports that she has never smoked. She has never used smokeless tobacco. She reports current alcohol use. She reports that she does not use drugs.  Family History  Problem Relation Age of Onset   Transient ischemic attack Mother    Stroke Mother    Allergic rhinitis Mother    Heart failure Mother    Liver cancer Father    Cancer Father        testicular, Liver   Allergic rhinitis Father    Uterine cancer Sister        GTD   Allergic rhinitis Sister    Hypothyroidism Maternal Grandmother    Transient ischemic attack Maternal Grandfather    Transient ischemic attack Paternal Grandfather    Transient ischemic attack Other        grandparents   Breast cancer Neg Hx    The following portions of the patient's history were reviewed and updated as appropriate: allergies, current medications, past family history, past medical history, past social history, past surgical history and problem  list.  Review of Systems Pertinent items noted in HPI and remainder of comprehensive ROS otherwise negative.  Physical Exam:  BP 112/77   Pulse 86   Ht 5' 8 (1.727 m)   Wt 96.2 kg   LMP 02/24/2024 (Approximate)   BMI 32.23 kg/m   CONSTITUTIONAL: Well-developed, well-nourished female in no acute distress.  HENT:  Normocephalic, atraumatic, External right and left ear normal.  EYES: Conjunctivae and EOM are normal. NECK: Normal range of motion, supple, no masses.  No thyromegaly or nodules noted.  SKIN: Skin is warm and dry. No rash noted. MUSCULOSKELETAL: Normal range of motion. No tenderness.  No cyanosis, clubbing, or edema. NEUROLOGIC: Alert and oriented to person, place, and time. Normal gait. PSYCHIATRIC: Normal mood and affect. Normal behavior. Normal judgment and thought  content. CARDIOVASCULAR: Normal heart rate noted, regular rhythm RESPIRATORY: Clear to auscultation bilaterally. Effort and breath sounds normal, no problems with respiration noted. BREASTS: (Performed by Delon Emms, FNP) Symmetric in size. No masses, tenderness, skin changes, nipple drainage, or lymphadenopathy bilaterally. Performed in the presence of a chaperone. ABDOMEN: Soft, no distention noted.  No tenderness, rebound or guarding.  PELVIC: (Performed by Delon Emms, FNP). Normal appearing external genitalia and urethral meatus; normal appearing vaginal mucosa and cervix.  No abnormal vaginal discharge noted.  Pap smear obtained.   Normal uterine size, no other palpable masses, no uterine or adnexal tenderness.  Performed in the presence of a chaperone.   Assessment and Plan:  1. Well woman exam with routine gynecological exam (Primary)  - Pap smear today. Will contact patient with results and manage accordingly.  - Mammogram completed yesterday.  - Normal breast exam today. She was advised to perform periodic SBE.  - Routine preventative health maintenance measures emphasized.  Please refer to After Visit Summary for other counseling recommendations.    Oley Debby HERO, RN [employer] for [employer], [facility]

## 2024-03-01 ENCOUNTER — Ambulatory Visit: Payer: Self-pay | Admitting: Medical-Surgical

## 2024-03-02 ENCOUNTER — Other Ambulatory Visit: Payer: Self-pay | Admitting: Medical-Surgical

## 2024-03-02 DIAGNOSIS — R928 Other abnormal and inconclusive findings on diagnostic imaging of breast: Secondary | ICD-10-CM

## 2024-03-03 LAB — CYTOLOGY - PAP
Comment: NEGATIVE
Diagnosis: NEGATIVE
High risk HPV: NEGATIVE

## 2024-03-07 ENCOUNTER — Ambulatory Visit: Admitting: Medical-Surgical

## 2024-03-07 ENCOUNTER — Encounter: Payer: Self-pay | Admitting: Medical-Surgical

## 2024-03-07 VITALS — BP 108/71 | HR 83 | Resp 20 | Ht 68.0 in | Wt 212.0 lb

## 2024-03-07 DIAGNOSIS — Z23 Encounter for immunization: Secondary | ICD-10-CM

## 2024-03-07 DIAGNOSIS — R21 Rash and other nonspecific skin eruption: Secondary | ICD-10-CM | POA: Diagnosis not present

## 2024-03-07 MED ORDER — TRIAMCINOLONE ACETONIDE 0.1 % EX CREA: 1.0000 | TOPICAL_CREAM | Freq: Two times a day (BID) | CUTANEOUS | 0 refills | Status: AC

## 2024-03-07 NOTE — Progress Notes (Signed)
        Established patient visit   History of Present Illness   Discussed the use of AI scribe software for clinical note transcription with the patient, who gave verbal consent to proceed.  History of Present Illness   Sarah Simmons is a 43 year old female who presents with a persistent skin lesion on her lower body.  Cutaneous lesion - Persistent skin lesion on lower body first noticed approximately three weeks ago - Initially mistaken for a scab - No changes in size, shape, or color since onset - No pruritus, burning, or drainage - No treatments applied - History of prior sunburn in the affected area    Physical Exam   Physical Exam Vitals reviewed.  Constitutional:      General: She is not in acute distress.    Appearance: Normal appearance.  HENT:     Head: Normocephalic and atraumatic.  Cardiovascular:     Rate and Rhythm: Normal rate and regular rhythm.     Pulses: Normal pulses.     Heart sounds: Normal heart sounds. No murmur heard.    No friction rub. No gallop.  Pulmonary:     Effort: Pulmonary effort is normal. No respiratory distress.     Breath sounds: Normal breath sounds. No wheezing.  Skin:    General: Skin is warm and dry.         Comments: 1cm x 0.5cm slightly raised rough dark pink lesion without surrounding erythema, swelling, or fluctuance. No obvious break in skin.   Neurological:     Mental Status: She is alert and oriented to person, place, and time.  Psychiatric:        Mood and Affect: Mood normal.        Behavior: Behavior normal.        Thought Content: Thought content normal.        Judgment: Judgment normal.    Assessment & Plan     Skin lesion of uncertain etiology Lesion present for three weeks, red, non-pruritic, non-burning, unchanged. Differential includes benign changes, insect bite, irritation. Inconsistent with typical skin cancer. - Prescribed triamcinolone  cream, apply twice daily for 14 days. - Consider cryotherapy if no  improvement in one week.  General Health Maintenance Flu shot administered. Blood pressure normal.     Follow up   Return if symptoms worsen or fail to improve. __________________________________ Sarah FREDRIK Palin, DNP, APRN, FNP-BC Primary Care and Sports Medicine [facility]

## 2024-03-09 ENCOUNTER — Ambulatory Visit
Admission: RE | Admit: 2024-03-09 | Discharge: 2024-03-09 | Disposition: A | Source: Ambulatory Visit | Attending: Medical-Surgical

## 2024-03-09 ENCOUNTER — Other Ambulatory Visit: Payer: Self-pay | Admitting: Medical-Surgical

## 2024-03-09 DIAGNOSIS — R928 Other abnormal and inconclusive findings on diagnostic imaging of breast: Secondary | ICD-10-CM

## 2024-03-09 DIAGNOSIS — N6489 Other specified disorders of breast: Secondary | ICD-10-CM

## 2024-03-10 ENCOUNTER — Ambulatory Visit: Payer: Self-pay | Admitting: Obstetrics and Gynecology

## 2024-09-08 ENCOUNTER — Encounter
